# Patient Record
Sex: Female | Born: 1980
Health system: Southern US, Community
[De-identification: ages and names within clinical notes are randomized; demographics above are authoritative.]

## PROBLEM LIST (undated history)

## (undated) ENCOUNTER — Inpatient Hospital Stay: Admission: RE | Payer: BC Managed Care – PPO | Source: Ambulatory Visit | Admitting: Obstetrics and Gynecology

## (undated) DIAGNOSIS — Z9889 Other specified postprocedural states: Secondary | ICD-10-CM

## (undated) DIAGNOSIS — R319 Hematuria, unspecified: Secondary | ICD-10-CM

## (undated) DIAGNOSIS — D649 Anemia, unspecified: Secondary | ICD-10-CM

## (undated) DIAGNOSIS — T7840XA Allergy, unspecified, initial encounter: Secondary | ICD-10-CM

## (undated) DIAGNOSIS — I10 Essential (primary) hypertension: Secondary | ICD-10-CM

## (undated) DIAGNOSIS — R112 Nausea with vomiting, unspecified: Secondary | ICD-10-CM

## (undated) HISTORY — DX: Allergy, unspecified, initial encounter: T78.40XA

## (undated) HISTORY — DX: Other specified postprocedural states: Z98.890

## (undated) HISTORY — PX: TUBAL LIGATION: SHX77

## (undated) HISTORY — DX: Essential (primary) hypertension: I10

## (undated) HISTORY — DX: Hematuria, unspecified: R31.9

## (undated) HISTORY — PX: OTHER SURGICAL HISTORY: SHX169

## (undated) HISTORY — DX: Anemia, unspecified: D64.9

## (undated) HISTORY — PX: LASIK: SHX215

## (undated) HISTORY — DX: Nausea with vomiting, unspecified: R11.2

---

## 2009-10-24 DIAGNOSIS — J329 Chronic sinusitis, unspecified: Secondary | ICD-10-CM | POA: Insufficient documentation

## 2010-05-06 ENCOUNTER — Encounter: Payer: Self-pay | Admitting: Obstetrics and Gynecology

## 2011-01-27 ENCOUNTER — Encounter: Payer: Self-pay | Admitting: Obstetrics and Gynecology

## 2011-03-22 ENCOUNTER — Observation Stay: Payer: Self-pay

## 2011-03-26 ENCOUNTER — Observation Stay: Payer: Self-pay

## 2011-04-08 ENCOUNTER — Inpatient Hospital Stay: Payer: Self-pay

## 2012-09-13 IMAGING — US US OB DETAIL+14 WK - NRPT [facility]
1 series · 14 of 28 positions shown · non-contrast
Comparison: none

[Series 1: us ob detail+14 wk - nrpt [facility] · 14 of 109 slices shown]
[im 5/109]
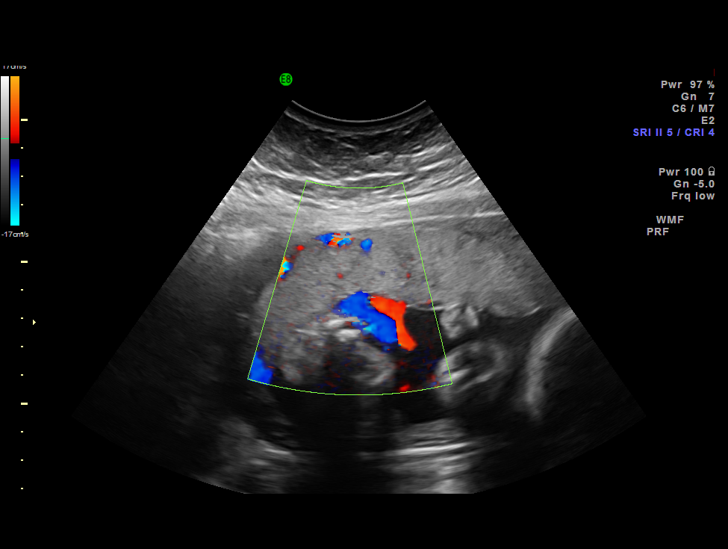
[im 13/109]
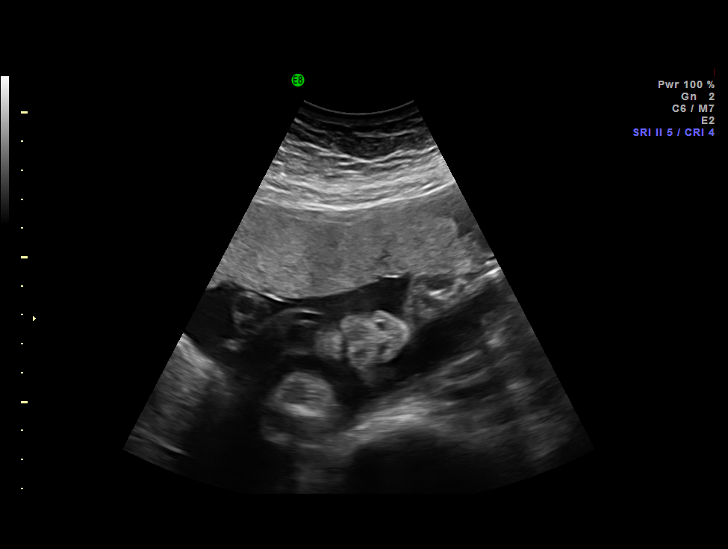
[im 21/109]
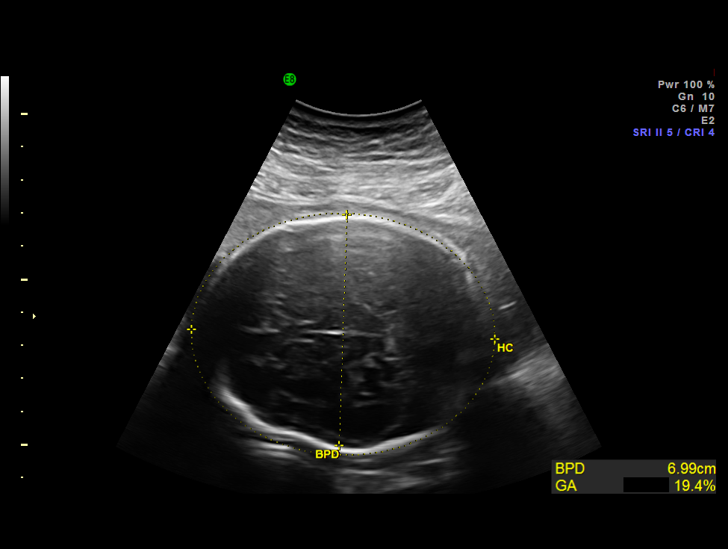
[im 29/109]
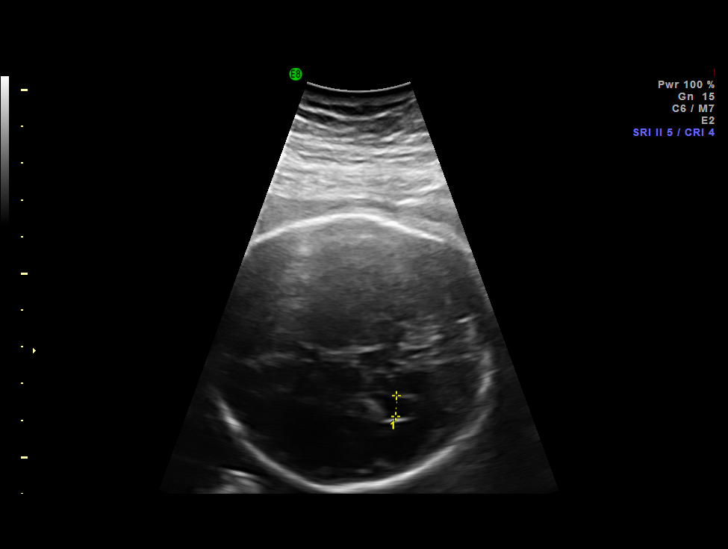
[im 37/109]
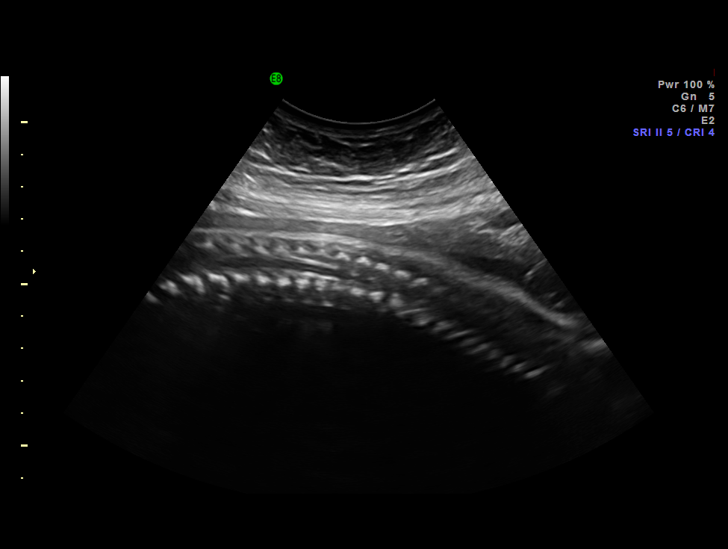
[im 45/109]
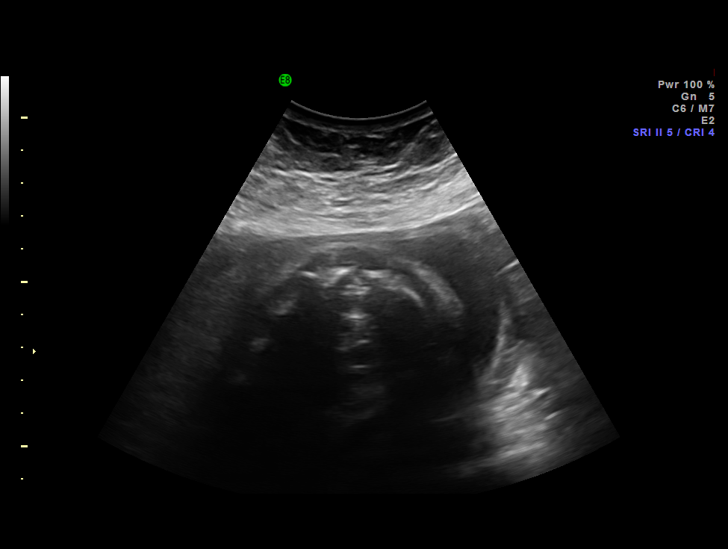
[im 53/109]
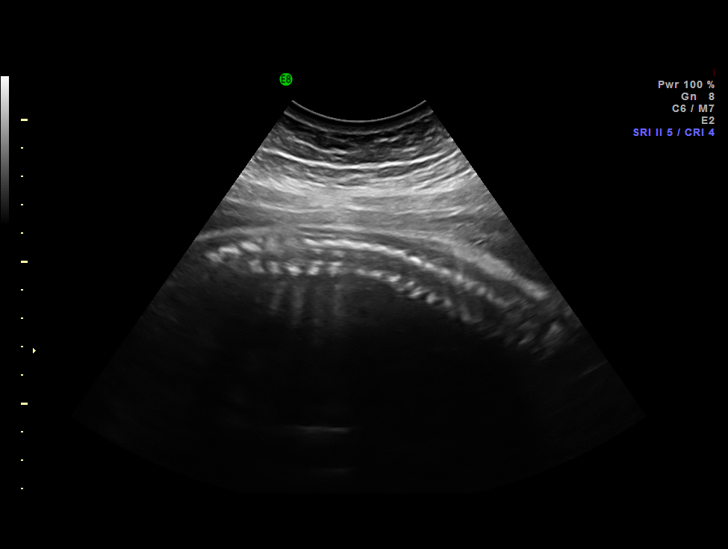
[im 61/109]
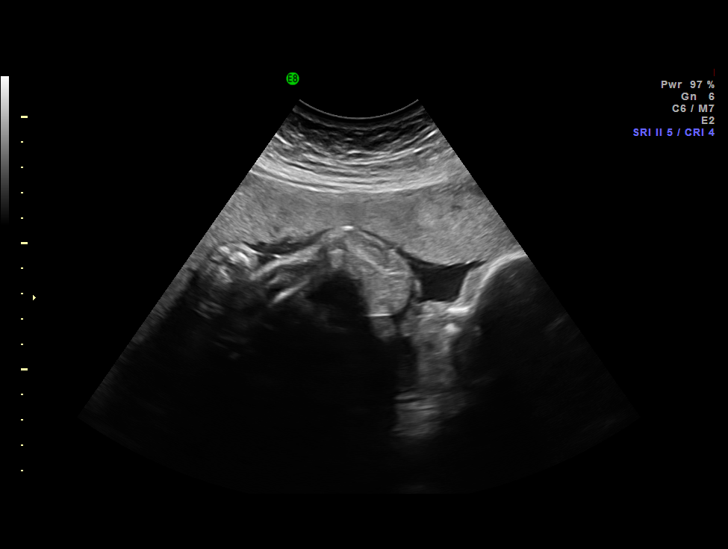
[im 69/109]
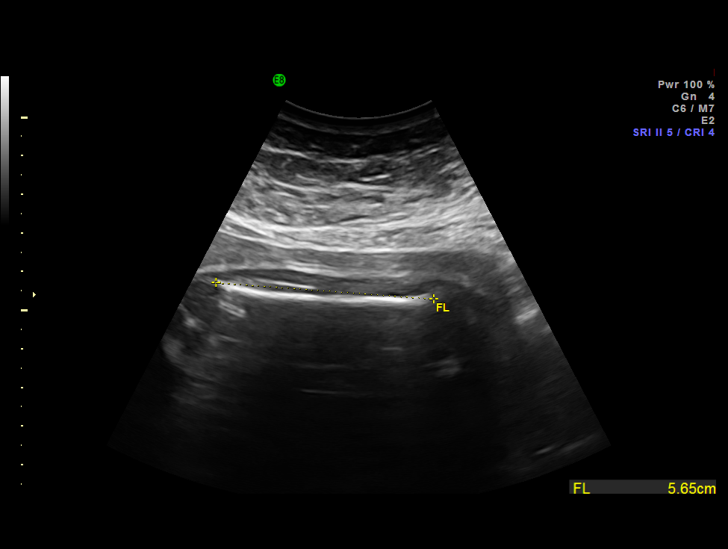
[im 77/109]
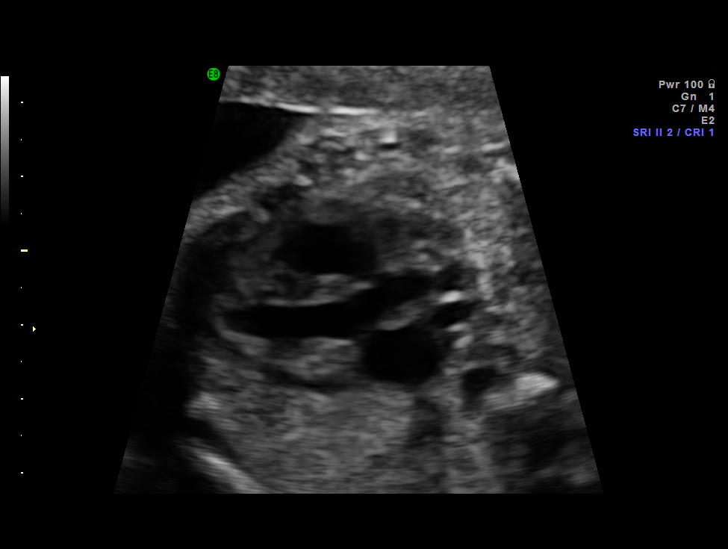
[im 85/109]
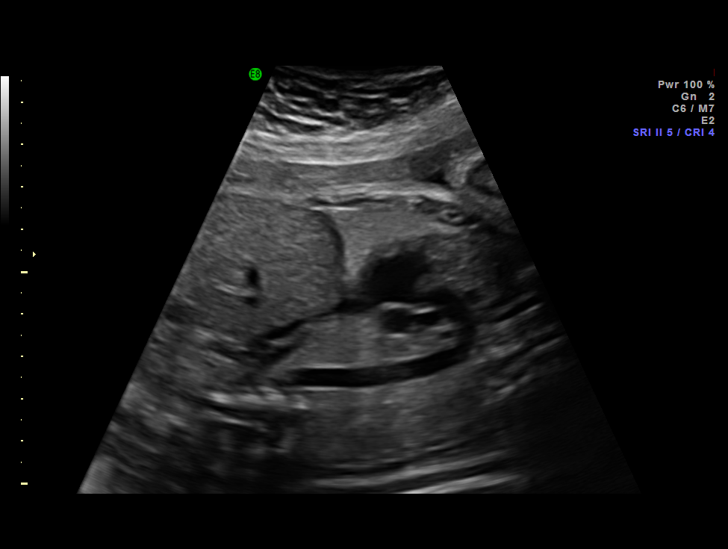
[im 93/109]
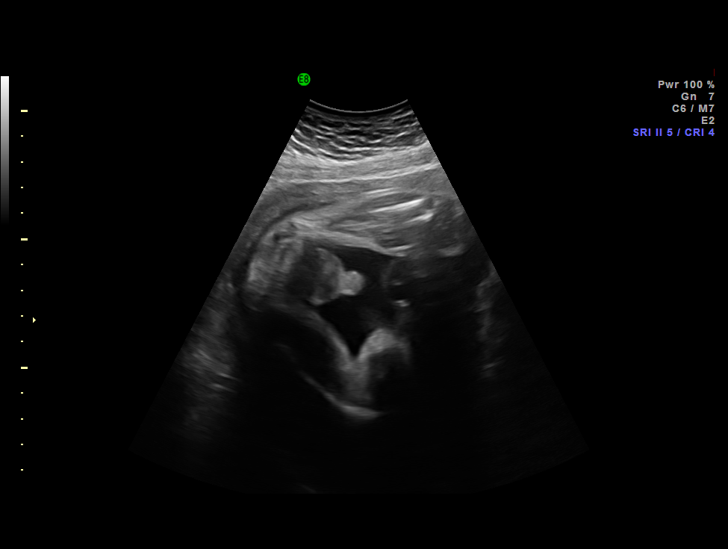
[im 101/109]
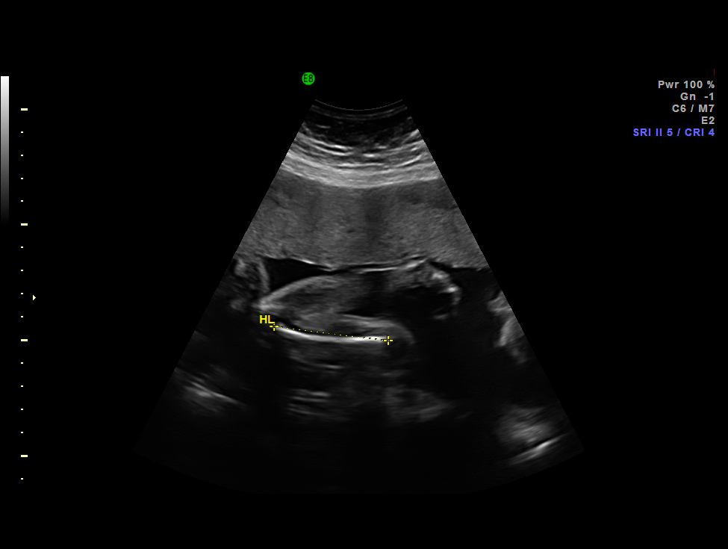
[im 109/109]
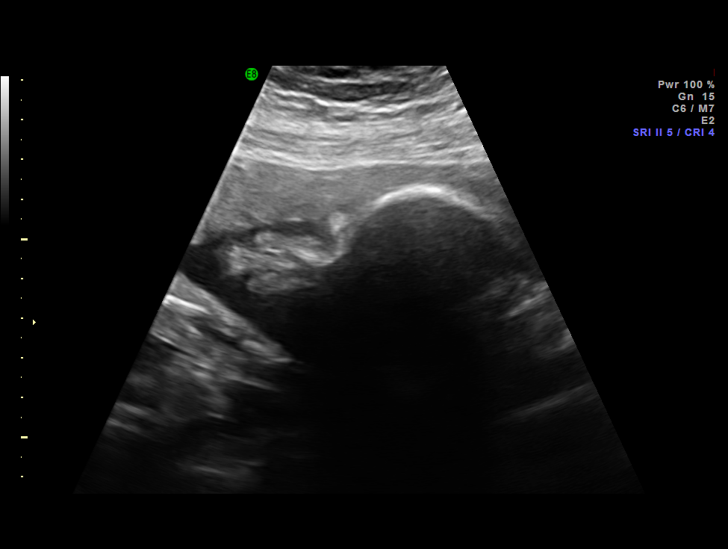

[14 of 28 positions shown; findings below may reference images not displayed]

IMAGES IMPORTED FROM THE SYNGO WORKFLOW SYSTEM
NO DICTATION FOR STUDY

## 2013-07-13 ENCOUNTER — Ambulatory Visit: Payer: Self-pay | Admitting: Family Medicine

## 2013-07-13 LAB — BASIC METABOLIC PANEL
BUN: 15 mg/dL (ref 4–21)
Creatinine: 1 mg/dL (ref 0.5–1.1)
Glucose: 85 mg/dL
Potassium: 3.8 mmol/L (ref 3.4–5.3)
Sodium: 141 mmol/L (ref 137–147)

## 2013-07-13 LAB — TSH: TSH: 2.49 u[IU]/mL (ref 0.41–5.90)

## 2013-07-13 LAB — HEPATIC FUNCTION PANEL
ALT: 19 U/L (ref 7–35)
AST: 16 U/L (ref 13–35)

## 2013-08-18 ENCOUNTER — Encounter: Payer: Self-pay | Admitting: Obstetrics and Gynecology

## 2014-06-08 LAB — HM PAP SMEAR: HM Pap smear: NEGATIVE

## 2014-06-11 ENCOUNTER — Other Ambulatory Visit: Payer: Self-pay

## 2014-06-11 LAB — HCG, QUANTITATIVE, PREGNANCY: Beta Hcg, Quant.: 114418 m[IU]/mL — ABNORMAL HIGH

## 2014-08-24 ENCOUNTER — Encounter: Payer: Self-pay | Admitting: Maternal & Fetal Medicine

## 2014-08-26 LAB — OB RESULTS CONSOLE VARICELLA ZOSTER ANTIBODY, IGG: Varicella: IMMUNE

## 2014-08-26 LAB — OB RESULTS CONSOLE HIV ANTIBODY (ROUTINE TESTING): HIV: NONREACTIVE

## 2014-08-26 LAB — OB RESULTS CONSOLE HEPATITIS B SURFACE ANTIGEN: HEP B S AG: NEGATIVE

## 2014-08-26 LAB — OB RESULTS CONSOLE GC/CHLAMYDIA
CHLAMYDIA, DNA PROBE: NEGATIVE
GC PROBE AMP, GENITAL: NEGATIVE

## 2014-08-26 LAB — OB RESULTS CONSOLE RUBELLA ANTIBODY, IGM: Rubella: IMMUNE

## 2014-08-26 LAB — OB RESULTS CONSOLE RPR: RPR: NONREACTIVE

## 2014-08-26 LAB — OB RESULTS CONSOLE ANTIBODY SCREEN: ANTIBODY SCREEN: NEGATIVE

## 2014-08-26 LAB — OB RESULTS CONSOLE ABO/RH: RH Type: POSITIVE

## 2015-01-05 NOTE — Consult Note (Signed)
Referral Information:  Reason for Referral 34yo [id number]  with longstanding hypertension, personal history of club feet and 19 yo son bilateral club feet. Pt has elevated BMI.   Referring Physician [facility]/ Dr Lethea Killings family medicine   Prenatal Hx HTN since age 17 - she was released from Nephrology care and is being seen by her family MD ,Dr Venia Minks, she has been on Labetalol and HCTZ during the pregnancy, d/ced the Norvasc when she found out she was pregnant.  REcent blood work at her family medicine office - I don't have access to that labwork  today..   Past Obstetrical Hx 03/2010 - 7 week SAB, no D&C, no sac or pole on u/s 2012  Cesarean for active phase arrest - no preeclampsia did not use aspirin, 6lbs 5 oz female bilateral clubbed feet splinting with Dr Chiquita Loth at [facility] Medications: Medication Instructions Status  prenatal multivitamin 1   once a day  Active  labetalol 100 mg oral tablet 1  orally 2 times a day  Active  Percocet 5/325 oral tablet 1-2 tab(s) orally every 4 to 6 hours, As Needed Active  Ferralet 90 oral tablet 1 tab(s) orally once a day Active   Allergies:   Morphine: Agitation  Vital Signs/Notes:  Nursing Vital Signs: **Vital Signs.:   04-Dec-14 10:05  Vital Signs Type Admission  Temperature Temperature (F) 97.8  Celsius 36.5  Temperature Source oral  Pulse Pulse 68  Respirations Respirations 18  Systolic BP Systolic BP 381  Diastolic BP (mmHg) Diastolic BP (mmHg) 72  Mean BP 88  Pulse Ox % Pulse Ox % 99  Pulse Ox Activity Level  At rest  Oxygen Delivery Room Air/ 21 %   Perinatal Consult:  LMP 26-Jun-2013   PGyn Hx Denies abnormal PAPs or STDs   PMed Hx Rubella Immune, Hx of varicella   Past Medical History cont'd 1.  Congenital club feet - corrected by Dr. Chiquita Loth at [facility] - no problems now 2.  ? Henoch-Schonlein pupura as a child (per nephrology notes and patient states she had a vascular rash at age 72 or 4 but didn't know that  this diagnosis was suspected) 3.  Hypertension since age 64 - was released  by Dr. 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  Chronic HTN since age 89 well controlled on 2 meds, no preeclampsia in past pregnancy (did not use aspirin)  2.  History of sab at 7 weeks  3.  Maternal h/o isolated club feet, son with club feet, met with genetic counselor - up to 50% recurrence 4. 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 elevated BMI 35- no gest DM in  last pregnancy   Recommendations 1.  We reviewed  risks of HTN in pregnancy including risk of fetal growth restriction  and risk of preeclampsia. She is currently on labetalol and HCTZ - a combination tha t has worked well for her . She is very well controlled on current regimen. While we don't usually intitiate HCTZ in pregnancy it is reasonable to continue it in those well controlled in it prior to pregnancy, There is a small risk of neonatal thrombocytopenia. Given the risk for preeclampsia with underlying HTN , I recommend baseline urine p/c ratio , baseline renal function tests, baseline liver function tests and serum uric acid to assist in comparison. Pt reports recent blood  work with Dr Venia Minks.   I did not repeat today though I did nothave access to those results - please draw labs as needed at her New Ob .  2.I reviewed USPTF recommendations for initiation of baby aspirin at 11-14 weeks for pts at risk for preeclampsia   She did not  take baby ASA last pregnancy.  I reviewed reduction in preex risk by approx  20-30%  3. Recommend monthly [place] for growth due to risk of FGR with maternal HTN , initiation of 2x weekly NST/AFI at 32-24 weeks and delivery by 39 weeks. 4. pt discussed prenatal diagnosis options with the gen counselor 5. Pior cesarean - pt will likely opt for ERLTCS . 6. Pt should limit weight gain to approximately 10-15 pounds- we did not discuss but referral to dietitian may be helpful. Needs diabetes testing during pregnancy. Early and late screening glucola should be considered.   Plan:  Prenatal Diagnosis Options Level II [place]   Ultrasound at what gestational ages Monthly > 28 weeks   Antepartum Testing depending on BP control 32 -34 weeks   Delivery Mode Cesarean, unless opts for VBAC   Additional Testing Folate/prenatal vitamins   Delivery at what gestational age [redacted] weeks    Total Time Spent with Patient 15 minutes, 20 min   >50% of visit spent in couseling/coordination of care yes   Office Use Only 99241  Level 1 (54mn) NEW office consult prob focused   Coding Description: MATERNAL CONDITIONS/HISTORY INDICATION(S).   Chronic HTN.   HTN - Chronic.  Electronic Signatures: LSharyn Creamer(MD)  (Signed 04-Dec-14 11:22)  Authored: Referral, Home Medications, Allergies, Vital Signs/Notes, Consult, Exam, Lab/Radiology Notes, Impression, Plan, Billing, Coding Description   Last Updated: 04-Dec-14 11:22 by LSharyn Creamer(MD)

## 2015-01-06 NOTE — Consult Note (Signed)
Referral Information:  Reason for Referral 34 yo gravida 4 para 1021 at 15w4dis referred by [facility] due to a history of CHTN, a personal history of bilateral club feet and a history of a son with bilateral club feet.   Referring Physician Dr. DEnzo Bi  [facility]x [facility].  [facility] [place]  Fetus then was 930w1dize at 9w55w4d Early [facility], which led to high blood pressure.  She had a subsequent ultrasound around the time of NIPT.  NIPT revealed normal XX.  She had a glucose screen on Tuesday which she presumes was normal.   Past Obstetrical Hx 1. 2011 - SAB at [redacted] weeks gestation 2. 03/11/2011 - C/S for failure to progress.  [facility]   Home Medications: Medication Instructions Status  prenatal multivitamin 1   once a day  Active  labetalol 200 mg oral tablet 300 milligram(s) orally once a day Active  labetalol 200 mg oral tablet 1 tab(s) orally once a day Active  hydrochlorothiazide 25 mg oral tablet 1 tab(s) orally once a day Active   Allergies:   Morphine: Agitation  Vital Signs/Notes:  Nursing Vital Signs: **Vital Signs.:   10-Dec-15 08:06  Vital Signs Type Routine  Temperature Temperature (F) 98.1  Celsius 36.7  Temperature Source oral  Pulse Pulse 76  Respirations Respirations 18  Systolic BP Systolic BP 115854iastolic BP (mmHg) Diastolic BP (mmHg) 63  Mean BP 80  Pulse Ox % Pulse Ox % 97  Pulse Ox Activity Level  At rest  Oxygen Delivery Room Air/ 21 %   Perinatal Consult:  LMP 16-Apr-2014   PGyn Hx Benign   PMed Hx Rubella Immune   Past Medical History cont'd HTN - diagnosed when hospitalized for Bell's palsy at age 86 73story of Bell's palsy - [facility] with bilateral club feet Tore ACL at age [age]id not require surgery   PSurg Hx  Bilateral club foot repair at 2 y52ar of age; revision on left foot age 24  61FHx Son with bilateral club feet -repaired; Mother - HTN; Father - HTN; MGM - CABG;   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 CHTN on labetalol 300 mg q AM and 200 mg q PM and HCTZ 25 mg daily 2. A personal history of bilateral club feet and a history of a son with bilateral club feet - S/P normal NIPT; normal anatomy ultrasound today 3. Previous cesarean 4. Obesity - with assumed normal glucose screen   Recommendations 1. CHTN on labetalol 300 mg q AM and 200 mg q PM and HCTZ 25 mg daily -- The patient was counseled that labetalol is a first line antihypertensive in pregnancy.  While HCTZ is not,  and we do not usually start it during pregnancy, we do continue it in women who are stable on a dose of 25 mg or less -- ASA 81 mg po daily -- Our recommendations for fetal surveillance are below. 2. A personal history of bilateral club feet and a history of a son with bilateral club feet - S/P normal NIPT; normal anatomy ultrasound today -- Had a follow-up visit with our genetic counselor, Donette Larry, today.  She had met with the couple last pregnancy.  The couple wanted to know if any other genetic counseling was indicated or desirable. - The patient will have AFP drawn in Dr. Thamas Jaegers office today. 3. Previous cesarean -- The patient is planning a repeat cesarean and when we discussed the fact that there is  essentially no additional surgical risk of having a BTL with the cesarean, she will likely elect a BTL rather than have her husband have a vasectomy 4. Obesity - with assumed normal glucose screen -- Fetal surveillance for CHTN -- Repeat glucose screen at 26-28 weeks   Plan:  Genetic Counseling yes, Done in the past; updated today   Prenatal Diagnosis Options First trimester, NIPT done - normal; AFP today   Ultrasound at what gestational ages Monthly > 28 weeks   Antepartum Testing Weekly, Starting at 32 weeks; Twice weekly starting at 34-36 weeks   Delivery Mode Cesarean   Delivery at what gestational age [redacted] weeks   Comment/Plan Thank you for allowing [place] to participate in her care.    Total Time Spent with Patient 45 minutes   >50% of visit spent in couseling/coordination of care yes   Office Use Only 99243  Level 3 (74mn) NEW office consult detailed   Coding Description: FETAL - 2nd/3rd TRIMESTER INDICATION(S).   MATERNAL CONDITIONS/HISTORY INDICATION(S).   HTN - Chronic.   Obesity - BMI greater than equal to 30.   OTHER: Patient with bilateral club feet; Previous child with bilateral club feet.  Electronic Signatures: JDellia Nims(MD)  (Signed 10-Dec-15 14:52)  Authored: Referral, Home Medications, Allergies, Vital Signs/Notes, Consult, Exam, Impression, Plan, Billing, Coding Description   Last Updated: 10-Dec-15 14:52 by JDellia Nims(MD)

## 2015-01-12 ENCOUNTER — Ambulatory Visit: Payer: Self-pay

## 2015-01-12 ENCOUNTER — Ambulatory Visit
Admit: 2015-01-12 | Disposition: A | Discharge status: Home / Self Care | Payer: Self-pay | Admitting: Obstetrics and Gynecology

## 2015-01-12 LAB — CBC WITH DIFFERENTIAL/PLATELET
BASOS PCT: 0.2 %
Basophil #: 0 10*3/uL (ref 0.0–0.1)
Eosinophil #: 0.1 10*3/uL (ref 0.0–0.7)
Eosinophil %: 1 %
HCT: 36.8 % (ref 35.0–47.0)
HGB: 12.3 g/dL (ref 12.0–16.0)
LYMPHS PCT: 14.9 %
Lymphocyte #: 1.2 10*3/uL (ref 1.0–3.6)
MCH: 31.4 pg (ref 26.0–34.0)
MCHC: 33.3 g/dL (ref 32.0–36.0)
MCV: 94 fL (ref 80–100)
Monocyte #: 0.5 x10 3/mm (ref 0.2–0.9)
Monocyte %: 6.8 %
Neutrophil #: 6.1 10*3/uL (ref 1.4–6.5)
Neutrophil %: 77.1 %
Platelet: 174 10*3/uL (ref 150–440)
RBC: 3.9 10*6/uL (ref 3.80–5.20)
RDW: 15.9 % — ABNORMAL HIGH (ref 11.5–14.5)
WBC: 7.9 10*3/uL (ref 3.6–11.0)

## 2015-01-12 NOTE — Anesthesia Preprocedure Evaluation (Addendum)
Anesthesia Evaluation  Patient identified by MRN, date of birth, ID band Patient awake    Reviewed: Allergy & Precautions, H&P , NPO status , Patient's Chart, lab work & pertinent test results  History of Anesthesia Complications (+) PONV and history of anesthetic complications  Airway Mallampati: I  TM Distance: >3 FB Neck ROM: Full    Dental  (+) Teeth Intact   Pulmonary  breath sounds clear to auscultation        Cardiovascular Exercise Tolerance: Good hypertension, On Medications and Pt. on medications IRhythm:Regular Rate:Normal     Neuro/Psych    GI/Hepatic GERD-  ,  Endo/Other    Renal/GU      Musculoskeletal   Abdominal   Peds  Hematology   Anesthesia Other Findings   Reproductive/Obstetrics (+) Pregnancy                           Anesthesia Physical Anesthesia Plan  ASA: II  Anesthesia Plan: Spinal   Post-op Pain Management:    Induction:   Airway Management Planned: Nasal Cannula  Additional Equipment: Fetal Monitoring  Intra-op Plan:   Post-operative Plan:   Informed Consent: I have reviewed the patients History and Physical, chart, labs and discussed the procedure including the risks, benefits and alternatives for the proposed anesthesia with the patient or authorized representative who has indicated his/her understanding and acceptance.     Plan Discussed with: CRNA  Anesthesia Plan Comments:         Anesthesia Quick Evaluation

## 2015-01-15 ENCOUNTER — Inpatient Hospital Stay: Payer: BC Managed Care – PPO | Admitting: *Deleted

## 2015-01-15 ENCOUNTER — Inpatient Hospital Stay: Payer: BC Managed Care – PPO | Admitting: Anesthesiology

## 2015-01-15 ENCOUNTER — Inpatient Hospital Stay
Admission: RE | Admit: 2015-01-15 | Discharge: 2015-01-18 | DRG: 765 | Disposition: A | Discharge status: Home / Self Care | Payer: BC Managed Care – PPO | Attending: Obstetrics and Gynecology | Admitting: Obstetrics and Gynecology

## 2015-01-15 ENCOUNTER — Encounter
Admission: RE | Disposition: A | Discharge status: Home / Self Care | Payer: Self-pay | Source: Home / Self Care | Attending: Obstetrics and Gynecology

## 2015-01-15 DIAGNOSIS — O1092 Unspecified pre-existing hypertension complicating childbirth: Secondary | ICD-10-CM | POA: Diagnosis present

## 2015-01-15 DIAGNOSIS — Z302 Encounter for sterilization: Secondary | ICD-10-CM | POA: Diagnosis not present

## 2015-01-15 DIAGNOSIS — O3421 Maternal care for scar from previous cesarean delivery: Secondary | ICD-10-CM | POA: Diagnosis present

## 2015-01-15 DIAGNOSIS — Z3A39 39 weeks gestation of pregnancy: Secondary | ICD-10-CM | POA: Diagnosis present

## 2015-01-15 DIAGNOSIS — Z3490 Encounter for supervision of normal pregnancy, unspecified, unspecified trimester: Secondary | ICD-10-CM

## 2015-01-15 SURGERY — Surgical Case
Anesthesia: Spinal | Wound class: Clean Contaminated

## 2015-01-15 MED ORDER — LIDOCAINE 5 % EX PTCH
MEDICATED_PATCH | CUTANEOUS | Status: DC | PRN
  Administered 2015-01-15: 1 via TRANSDERMAL

## 2015-01-15 MED ORDER — ACETAMINOPHEN 325 MG PO TABS: 650.0000 mg | ORAL_TABLET | ORAL | Status: DC | PRN

## 2015-01-15 MED ORDER — OXYTOCIN 40 UNITS IN LACTATED RINGERS INFUSION - SIMPLE MED
INTRAVENOUS | Status: DC | PRN
  Administered 2015-01-15: 40 [IU] via INTRAVENOUS

## 2015-01-15 MED ORDER — LACTATED RINGERS IV SOLN
INTRAVENOUS | Status: DC | PRN
  Administered 2015-01-15 (×2): via INTRAVENOUS

## 2015-01-15 MED ORDER — SCOPOLAMINE 1 MG/3DAYS TD PT72: 1.0000 | MEDICATED_PATCH | Freq: Once | TRANSDERMAL | Status: DC

## 2015-01-15 MED ORDER — DIBUCAINE 1 % RE OINT
1.0000 "application " | TOPICAL_OINTMENT | RECTAL | Status: DC | PRN
  Filled 2015-01-15: qty 28

## 2015-01-15 MED ORDER — POTASSIUM CITRATE-CITRIC ACID 1100-334 MG/5ML PO SOLN
10.0000 meq | Freq: Three times a day (TID) | ORAL | Status: DC
  Filled 2015-01-15 (×3): qty 5

## 2015-01-15 MED ORDER — OXYCODONE-ACETAMINOPHEN 5-325 MG PO TABS
1.0000 | ORAL_TABLET | ORAL | Status: DC | PRN
  Administered 2015-01-16 (×2): 1 via ORAL
  Filled 2015-01-15 (×5): qty 1

## 2015-01-15 MED ORDER — LACTATED RINGERS IV SOLN: INTRAVENOUS | Status: DC

## 2015-01-15 MED ORDER — CEFAZOLIN SODIUM-DEXTROSE 2-3 GM-% IV SOLR
INTRAVENOUS | Status: AC
  Filled 2015-01-15: qty 50

## 2015-01-15 MED ORDER — SIMETHICONE 80 MG PO CHEW
80.0000 mg | CHEWABLE_TABLET | ORAL | Status: DC
  Administered 2015-01-16: 80 mg via ORAL

## 2015-01-15 MED ORDER — OXYCODONE-ACETAMINOPHEN 5-325 MG PO TABS
2.0000 | ORAL_TABLET | ORAL | Status: DC | PRN
  Administered 2015-01-16 – 2015-01-18 (×9): 2 via ORAL
  Filled 2015-01-15 (×2): qty 2
  Filled 2015-01-15: qty 1
  Filled 2015-01-15 (×4): qty 2

## 2015-01-15 MED ORDER — PRENATAL MULTIVITAMIN CH
1.0000 | ORAL_TABLET | Freq: Every day | ORAL | Status: DC
  Administered 2015-01-16 – 2015-01-18 (×3): 1 via ORAL
  Filled 2015-01-15 (×3): qty 1

## 2015-01-15 MED ORDER — KETOROLAC TROMETHAMINE 30 MG/ML IJ SOLN
30.0000 mg | Freq: Four times a day (QID) | INTRAMUSCULAR | Status: DC | PRN
  Administered 2015-01-15: 30 mg via INTRAVENOUS
  Filled 2015-01-15: qty 1

## 2015-01-15 MED ORDER — MORPHINE SULFATE (PF) 0.5 MG/ML IJ SOLN
INTRAMUSCULAR | Status: DC | PRN
Start: 2015-01-15 — End: 2015-01-15
  Administered 2015-01-15: .1 mg via INTRATHECAL

## 2015-01-15 MED ORDER — SODIUM CHLORIDE 0.9 % IJ SOLN: 3.0000 mL | INTRAMUSCULAR | Status: DC | PRN

## 2015-01-15 MED ORDER — ONDANSETRON HCL 4 MG/2ML IJ SOLN
INTRAMUSCULAR | Status: DC | PRN
  Administered 2015-01-15: 4 mg via INTRAVENOUS

## 2015-01-15 MED ORDER — OXYTOCIN 40 UNITS IN LACTATED RINGERS INFUSION - SIMPLE MED
62.5000 mL/h | INTRAVENOUS | Status: DC
  Administered 2015-01-15: 62.5 mL/h via INTRAVENOUS
  Filled 2015-01-15: qty 1000

## 2015-01-15 MED ORDER — MEASLES, MUMPS & RUBELLA VAC ~~LOC~~ INJ: 0.5000 mL | INJECTION | Freq: Once | SUBCUTANEOUS | Status: DC

## 2015-01-15 MED ORDER — CITRIC ACID-SODIUM CITRATE 334-500 MG/5ML PO SOLN
ORAL | Status: AC
  Administered 2015-01-15: 30 mL via ORAL
  Filled 2015-01-15: qty 15

## 2015-01-15 MED ORDER — NALBUPHINE HCL 10 MG/ML IJ SOLN: 5.0000 mg | Freq: Once | INTRAMUSCULAR | Status: AC | PRN

## 2015-01-15 MED ORDER — FERROUS SULFATE 325 (65 FE) MG PO TABS
325.0000 mg | ORAL_TABLET | Freq: Two times a day (BID) | ORAL | Status: DC
  Administered 2015-01-15 – 2015-01-18 (×5): 325 mg via ORAL
  Filled 2015-01-15 (×5): qty 1

## 2015-01-15 MED ORDER — CEFAZOLIN SODIUM-DEXTROSE 2-3 GM-% IV SOLR
2.0000 g | INTRAVENOUS | Status: AC
  Administered 2015-01-15: 2 g via INTRAVENOUS

## 2015-01-15 MED ORDER — CITRIC ACID-SODIUM CITRATE 334-500 MG/5ML PO SOLN
30.0000 mL | Freq: Once | ORAL | Status: AC
  Administered 2015-01-15: 30 mL via ORAL

## 2015-01-15 MED ORDER — SIMETHICONE 80 MG PO CHEW
80.0000 mg | CHEWABLE_TABLET | Freq: Three times a day (TID) | ORAL | Status: DC
  Administered 2015-01-17 (×3): 80 mg via ORAL
  Filled 2015-01-15 (×7): qty 1

## 2015-01-15 MED ORDER — LACTATED RINGERS IV BOLUS (SEPSIS)
1000.0000 mL | Freq: Once | INTRAVENOUS | Status: AC
  Administered 2015-01-15: 08:00:00 via INTRAVENOUS
  Administered 2015-01-15: 1000 mL via INTRAVENOUS

## 2015-01-15 MED ORDER — LACTATED RINGERS IV BOLUS (SEPSIS)
1000.0000 mL | Freq: Once | INTRAVENOUS | Status: AC
  Administered 2015-01-15: 1000 mL via INTRAVENOUS

## 2015-01-15 MED ORDER — SENNOSIDES-DOCUSATE SODIUM 8.6-50 MG PO TABS
2.0000 | ORAL_TABLET | ORAL | Status: DC
  Administered 2015-01-16: 2 via ORAL
  Filled 2015-01-15 (×3): qty 2

## 2015-01-15 MED ORDER — TETANUS-DIPHTH-ACELL PERTUSSIS 5-2.5-18.5 LF-MCG/0.5 IM SUSP: 0.5000 mL | Freq: Once | INTRAMUSCULAR | Status: DC

## 2015-01-15 MED ORDER — DIPHENHYDRAMINE HCL 50 MG/ML IJ SOLN: 12.5000 mg | INTRAMUSCULAR | Status: DC | PRN

## 2015-01-15 MED ORDER — NALBUPHINE HCL 10 MG/ML IJ SOLN
5.0000 mg | Freq: Once | INTRAMUSCULAR | Status: AC | PRN
  Filled 2015-01-15: qty 0.5

## 2015-01-15 MED ORDER — EPHEDRINE SULFATE 50 MG/ML IJ SOLN
INTRAMUSCULAR | Status: DC | PRN
  Administered 2015-01-15 (×3): 5 mg via INTRAVENOUS

## 2015-01-15 MED ORDER — SIMETHICONE 80 MG PO CHEW
80.0000 mg | CHEWABLE_TABLET | ORAL | Status: DC | PRN
  Administered 2015-01-15 – 2015-01-16 (×2): 80 mg via ORAL

## 2015-01-15 MED ORDER — NALOXONE HCL 1 MG/ML IJ SOLN: 1.0000 ug/kg/h | INTRAVENOUS | Status: DC | PRN

## 2015-01-15 MED ORDER — WITCH HAZEL-GLYCERIN EX PADS: 1.0000 "application " | MEDICATED_PAD | CUTANEOUS | Status: DC | PRN

## 2015-01-15 MED ORDER — MENTHOL 3 MG MT LOZG: 1.0000 | LOZENGE | OROMUCOSAL | Status: DC | PRN

## 2015-01-15 MED ORDER — ONDANSETRON HCL 4 MG/2ML IJ SOLN: 4.0000 mg | Freq: Three times a day (TID) | INTRAMUSCULAR | Status: DC | PRN

## 2015-01-15 MED ORDER — NALOXONE HCL 0.4 MG/ML IJ SOLN
0.4000 mg | INTRAMUSCULAR | Status: DC | PRN
  Filled 2015-01-15: qty 1

## 2015-01-15 MED ORDER — DIPHENHYDRAMINE HCL 25 MG PO CAPS: 25.0000 mg | ORAL_CAPSULE | ORAL | Status: DC | PRN

## 2015-01-15 MED ORDER — IBUPROFEN 800 MG PO TABS
800.0000 mg | ORAL_TABLET | Freq: Three times a day (TID) | ORAL | Status: DC
  Administered 2015-01-15 – 2015-01-18 (×9): 800 mg via ORAL
  Filled 2015-01-15 (×9): qty 1

## 2015-01-15 MED ORDER — LIDOCAINE 5 % EX PTCH
1.0000 | MEDICATED_PATCH | CUTANEOUS | Status: DC
  Administered 2015-01-16 – 2015-01-17 (×2): 1 via TRANSDERMAL
  Filled 2015-01-15 (×3): qty 1

## 2015-01-15 MED ORDER — LANOLIN HYDROUS EX OINT
1.0000 "application " | TOPICAL_OINTMENT | CUTANEOUS | Status: DC | PRN
  Filled 2015-01-15: qty 28

## 2015-01-15 MED ORDER — NALBUPHINE HCL 10 MG/ML IJ SOLN: 5.0000 mg | INTRAMUSCULAR | Status: DC | PRN

## 2015-01-15 MED ORDER — LIDOCAINE 5 % EX PTCH
MEDICATED_PATCH | CUTANEOUS | Status: AC
Start: 2015-01-15 — End: 2015-01-15
  Filled 2015-01-15: qty 1

## 2015-01-15 MED ORDER — KETOROLAC TROMETHAMINE 30 MG/ML IJ SOLN: 30.0000 mg | Freq: Four times a day (QID) | INTRAMUSCULAR | Status: DC | PRN

## 2015-01-15 MED ORDER — DIPHENHYDRAMINE HCL 25 MG PO CAPS: 25.0000 mg | ORAL_CAPSULE | Freq: Four times a day (QID) | ORAL | Status: DC | PRN

## 2015-01-15 SURGICAL SUPPLY — 22 items
CANISTER SUCT 3000ML (MISCELLANEOUS) ×2 IMPLANT
CHLORAPREP W/TINT 26ML (MISCELLANEOUS) ×2 IMPLANT
DRSG TELFA 3X8 NADH (GAUZE/BANDAGES/DRESSINGS) ×2 IMPLANT
GAUZE SPONGE 4X4 12PLY STRL (GAUZE/BANDAGES/DRESSINGS) ×2 IMPLANT
GLOVE BIO SURGEON STRL SZ8 (GLOVE) ×2 IMPLANT
GOWN STRL REUS W/ TWL LRG LVL3 (GOWN DISPOSABLE) ×2 IMPLANT
GOWN STRL REUS W/ TWL XL LVL3 (GOWN DISPOSABLE) ×1 IMPLANT
GOWN STRL REUS W/TWL LRG LVL3 (GOWN DISPOSABLE) ×2
GOWN STRL REUS W/TWL XL LVL3 (GOWN DISPOSABLE) ×1
NS IRRIG 1000ML POUR BTL (IV SOLUTION) ×2 IMPLANT
PACK C SECTION AR (MISCELLANEOUS) ×2 IMPLANT
PAD GROUND ADULT SPLIT (MISCELLANEOUS) ×2 IMPLANT
PAD OB MATERNITY 4.3X12.25 (PERSONAL CARE ITEMS) ×2 IMPLANT
PAD PREP 24X41 OB/GYN DISP (PERSONAL CARE ITEMS) ×2 IMPLANT
STRAP SAFETY BODY (MISCELLANEOUS) ×2 IMPLANT
SUT CHROMIC 1-0 (SUTURE) ×6 IMPLANT
SUT MAXON ABS #0 GS21 30IN (SUTURE) ×4 IMPLANT
SUT PLAIN GUT 0 (SUTURE) ×4 IMPLANT
SUT VIC AB 2-0 CT1 27 (SUTURE) ×2
SUT VIC AB 2-0 CT1 TAPERPNT 27 (SUTURE) ×2 IMPLANT
SUT VIC AB 4-0 KS 27 (SUTURE) ×2 IMPLANT
TAPE CLOTH SOFT 2X10 (GAUZE/BANDAGES/DRESSINGS) ×2 IMPLANT

## 2015-01-15 NOTE — Transfer of Care (Signed)
Immediate Anesthesia Transfer of Care Note  Patient: Rebecca Freeman  Procedure(s) Performed: Procedure(s): REPEAT CESAREAN SECTION AND BILATERAL TUBAL LIGATION  (N/A)  Patient Location: PACU  Anesthesia Type:Spinal  Level of Consciousness: alert , oriented and patient cooperative  Airway & Oxygen Therapy: Patient Spontanous Breathing and room air  Post-op Assessment: Post -op Vital signs reviewed and stable  Post vital signs: Reviewed and stable  Last Vitals:  Filed Vitals:   01/15/15 0931  BP: 110/60  Pulse: 58  Temp: 35.7 C  Resp: 16    Complications: No apparent anesthesia complications

## 2015-01-15 NOTE — OR Nursing (Signed)
Placenta collected and labeled and placed in placenta refrigerator per policy to be held for 7 days.

## 2015-01-15 NOTE — Anesthesia Postprocedure Evaluation (Signed)
  Anesthesia Post-op Note  Patient: Rebecca Freeman  Procedure(s) Performed: Procedure(s): REPEAT CESAREAN SECTION AND BILATERAL TUBAL LIGATION  (N/A)  Anesthesia type:Spinal  Patient location: PACU  Post pain: Pain level controlled  Post assessment: Post-op Vital signs reviewed, Patient's Cardiovascular Status Stable, Respiratory Function Stable, Patent Airway and No signs of Nausea or vomiting  Post vital signs: Reviewed and stable  Last Vitals:  Filed Vitals:   01/15/15 0931  BP: 110/60  Pulse: 58  Temp: 35.7 C  Resp: 16    Level of consciousness: awake, alert  and patient cooperative  Complications: No apparent anesthesia complications

## 2015-01-15 NOTE — Anesthesia Procedure Notes (Signed)
Spinal Patient location during procedure: OR Start time: 01/15/2015 8:20 AM Staffing Anesthesiologist: RICE, AMY Performed by: anesthesiologist  Preanesthetic Checklist Completed: patient identified, IV checked and risks and benefits discussed Spinal Block Patient position: sitting Prep: Betadine Patient monitoring: heart rate, cardiac monitor, continuous pulse ox and blood pressure Approach: midline Location: L3-4 Injection technique: single-shot Needle Needle type: Whitacre and Introducer  Needle gauge: 25 G Needle length: 12.7 cm (119 mm)

## 2015-01-15 NOTE — Op Note (Signed)
Op note:  Preoperative diagnosis: 1. Term intrauterine pregnancy, undelivered 2. Chronic hypertension 3. Previous cesarean section delivery 4. Desires Elective Sterilization  Postoperative diagnosis: 1. Term intrauterine pregnancy, delivered 2. Chronic hypertension 3. Previous cesarean section delivery 4.  Viable Female   Operative procedure: 1. Repeat low transverse cesarean section 2. Bilateral partial salpingectomy  Surgeon Dr. Greggory KeeneFrancesco  First Asst.: Dr. Valentino Saxonherry  Anesthesia: Spinal, Dr. Dimple Caseyice  INDICATION: The patient is a 34 year old married white female gravida 4 para 1021 at 6839 gestation who presents for repeat cesarean section delivery and bilateral partial salpingectomy. Prenatal course was complicated by chronic hypertension.  DESCRIPTION OF PROCEDURE: Patient was brought to the operating room where she was placed in the supine position. Spinal anesthetic was introduced without difficulty. She was placed in the supine position with a right lateral hip roll in place.Foley catheter was inserted and was draining clear yellow urine. The abdomen was prepped with ChloraPrep solution and draped in a sterile manner. After timeout and checking for adequate level of anesthesia the procedure was started. Pfannenstiel incision was made into the abdomen. The fascia was incised transversely and extended bilaterally with Mayo scissors. The rectus muscle was dissected off the fascia through sharp and blunt dissection. The midline raphae was identified and separated and the peritoneum was entered. Bladder flap was created over lower uterine segment with sharp dissection. A low transverse incision was made in the uterus and this was extended cephalad and caudad in standard fashion. Amniotic fluid was clear. The infant was delivered through vertex presentation and was noted to be active at birth. The umbilical cord was doubly clamped and cut and the infant was handed off to the resuscitating team.  Cord blood sampling was not obtained. Placenta was expressed from the uterus. Uterus was externalized onto the anterior abdominal wall and was cleared of all debris with laps. The incision was closed in one layer using #1 chromic suture in a running locking manner. Bilateral partial salpingectomy was then performed in routine fashion. Fallopian tube was grasped with a Babcock clamp. Midsegment of the fallopian tube was tied off with two 0 plain sutures. The first tie was a free tie. A second stick tie was placed as well. The intervening tube segment was resected. Similar procedure was carried out on the contralateral tube. Good hemostasis was noted. The uterus was then placed back into the abdominal pelvic cavity. Gutters were cleared of all debris with laps. The incision was then closed in layers with 0 Maxon being used on the fascia in a simple running manner. Subcutaneous tissues were reapproximated with 2-0 Vicryl suture. The skin was closed with a subcuticular stitch of 4-0 Vicryl. Steri-Strips were placed. A Lidoderm patch was placed for analgesia. The patient was then mobilized and taken to the recovery room in satisfactory condition. Estimated blood loss: 750 mL IV fluids: 1800 mL Urine output 50 mL. All instruments and needles and sponge counts were verified as correct. The patient did receive Ancef 2 g antibiotic prophylaxis.

## 2015-01-15 NOTE — H&P (Signed)
Preop Clearance. Paper H&P done previously. No interval issues. Rebecca Freeman, Daphine DeutscherMARTIN, MD

## 2015-01-15 NOTE — Evaluation (Signed)
I just saw Rebecca Freeman, and she is doing well--no pain, no itching, no nausea.

## 2015-01-16 LAB — CBC
HEMATOCRIT: 33.1 % — AB (ref 35.0–47.0)
Hemoglobin: 11.4 g/dL — ABNORMAL LOW (ref 12.0–16.0)
MCH: 32.6 pg (ref 26.0–34.0)
MCHC: 34.5 g/dL (ref 32.0–36.0)
MCV: 94.5 fL (ref 80.0–100.0)
Platelets: 138 10*3/uL — ABNORMAL LOW (ref 150–440)
RBC: 3.5 MIL/uL — AB (ref 3.80–5.20)
RDW: 16.2 % — AB (ref 11.5–14.5)
WBC: 8.6 10*3/uL (ref 3.6–11.0)

## 2015-01-16 NOTE — Anesthesia Postprocedure Evaluation (Cosign Needed)
  Anesthesia Post-op Note  Patient: Rebecca Freeman  Procedure(s) Performed: Procedure(s): REPEAT CESAREAN SECTION AND BILATERAL TUBAL LIGATION  (N/A)  Anesthesia type:Spinal  Patient location: Floor  Post pain: Pain level controlled  Post assessment: Post-op Vital signs reviewed, Patient's Cardiovascular Status Stable, Respiratory Function Stable, Patent Airway and No signs of Nausea or vomiting  Post vital signs: Reviewed and stable  Last Vitals:  Filed Vitals:   01/16/15 0300  BP: 116/59  Pulse: 63  Temp: 36.7 C  Resp: 18    Level of consciousness: awake, alert  and patient cooperative  Complications: No apparent anesthesia complications

## 2015-01-16 NOTE — Progress Notes (Signed)
Subjective: Postpartum Day 1: Cesarean Delivery Patient reports tolerating PO, + flatus and no problems voiding.    Objective: Vital signs in last 24 hours: Temp:  [96.3 F (35.7 C)-98.4 F (36.9 C)] 98.1 F (36.7 C) (05/03 0300) Pulse Rate:  [49-109] 63 (05/03 0300) Resp:  [16-20] 18 (05/03 0300) BP: (110-124)/(59-79) 116/59 mmHg (05/03 0300) SpO2:  [98 %-100 %] 100 % (05/03 0300) Weight:  [96.163 kg (212 lb)] 96.163 kg (212 lb) (05/02 0915)  Physical Exam:  General: alert and no distress Lochia: appropriate Uterine Fundus: firm Incision: clean/dry/intact DVT Evaluation: No cords or calf tenderness.   Recent Labs  01/16/15 0550  HGB 11.4*  HCT 33.1*    Assessment/Plan: Status post Cesarean section. Doing well postoperatively.  Continue current care.  Rebecca Freeman, Daphine DeutscherMARTIN 01/16/2015, 7:36 AM

## 2015-01-17 MED ORDER — ASPIRIN EC 81 MG PO TBEC: 81.0000 mg | DELAYED_RELEASE_TABLET | Freq: Every day | ORAL | Status: DC

## 2015-01-17 MED ORDER — SENNOSIDES-DOCUSATE SODIUM 8.6-50 MG PO TABS
2.0000 | ORAL_TABLET | ORAL | Status: DC
  Administered 2015-01-17: 2 via ORAL
  Filled 2015-01-17: qty 2

## 2015-01-17 MED ORDER — SIMETHICONE 80 MG PO CHEW
80.0000 mg | CHEWABLE_TABLET | ORAL | Status: DC
  Administered 2015-01-17 – 2015-01-18 (×2): 80 mg via ORAL
  Filled 2015-01-17: qty 1

## 2015-01-17 MED ORDER — LABETALOL HCL 200 MG PO TABS
200.0000 mg | ORAL_TABLET | Freq: Two times a day (BID) | ORAL | Status: DC
  Administered 2015-01-17 – 2015-01-18 (×3): 200 mg via ORAL
  Filled 2015-01-17 (×3): qty 1

## 2015-01-17 MED ORDER — HYDROCHLOROTHIAZIDE 25 MG PO TABS
25.0000 mg | ORAL_TABLET | Freq: Every day | ORAL | Status: DC
Start: 2015-01-17 — End: 2015-01-18
  Administered 2015-01-17 – 2015-01-18 (×2): 25 mg via ORAL
  Filled 2015-01-17 (×2): qty 1

## 2015-01-17 NOTE — Addendum Note (Signed)
Addendum  created 01/17/15 1219 by Linward NatalAmy Shanitra Phillippi, MD   Modules edited: Anesthesia Attestations

## 2015-01-17 NOTE — Anesthesia Postprocedure Evaluation (Cosign Needed)
  Anesthesia Post-op Note  Patient: Rebecca Freeman  Procedure(s) Performed: Procedure(s): REPEAT CESAREAN SECTION AND BILATERAL TUBAL LIGATION  (N/A)  Anesthesia type:Spinal  Patient location: PACU  Post pain: Pain level controlled  Post assessment: Post-op Vital signs reviewed, Patient's Cardiovascular Status Stable, Respiratory Function Stable, Patent Airway and No signs of Nausea or vomiting  Post vital signs: Reviewed and stable  Last Vitals:  Filed Vitals:   01/17/15 0435  BP:   Pulse:   Temp: 36.7 C  Resp:     Level of consciousness: awake, alert  and patient cooperative  Complications: No apparent anesthesia complications  

## 2015-01-17 NOTE — Progress Notes (Signed)
Subjective: Postpartum Day 2: Cesarean Delivery Patient reports tolerating PO.    Objective: Vital signs in last 24 hours: Temp:  [97.5 F (36.4 C)-98.4 F (36.9 C)] 98.1 F (36.7 C) (05/04 0435) Pulse Rate:  [65-90] 79 (05/03 2336) Resp:  [18] 18 (05/03 2336) BP: (116-140)/(75-86) 130/75 mmHg (05/03 2336) SpO2:  [100 %] 100 % (05/03 2336)  Physical Exam:  General: alert Lochia: appropriate Uterine Fundus: firm Incision: healing well DVT Evaluation: No evidence of DVT seen on physical exam.   Recent Labs  01/16/15 0550  HGB 11.4*  HCT 33.1*    Assessment/Plan: Status post Cesarean section. Doing well postoperatively.  Continue current care.  Tijah Hane, Daphine DeutscherMARTIN 01/17/2015, 7:42 AM

## 2015-01-17 NOTE — Progress Notes (Signed)
Vs stable; taking 2 BP meds (labetalol and HCTZ); up ad lib; taking motrin and percocet for pain control; breastfeeding well with no assistance needed from RN; husband is supportive

## 2015-01-17 NOTE — Addendum Note (Signed)
Addendum  created 01/17/15 0946 by Malva Coganatherine Wilber Fini, CRNA   Modules edited: Notes Section   Notes Section:  File: [id number]; File: [id number]; File: [id number]

## 2015-01-17 NOTE — Anesthesia Postprocedure Evaluation (Signed)
  Anesthesia Post-op Note  Patient: Rebecca Freeman  Procedure(s) Performed: Procedure(s): REPEAT CESAREAN SECTION AND BILATERAL TUBAL LIGATION  (N/A)  Anesthesia type:Spinal  Patient location: PACU  Post pain: Pain level controlled  Post assessment: Post-op Vital signs reviewed, Patient's Cardiovascular Status Stable, Respiratory Function Stable, Patent Airway and No signs of Nausea or vomiting  Post vital signs: Reviewed and stable  Last Vitals:  Filed Vitals:   01/17/15 0435  BP:   Pulse:   Temp: 36.7 C  Resp:     Level of consciousness: awake, alert  and patient cooperative  Complications: No apparent anesthesia complications

## 2015-01-18 LAB — SURGICAL PATHOLOGY

## 2015-01-18 MED ORDER — IBUPROFEN 800 MG PO TABS: 800.0000 mg | ORAL_TABLET | Freq: Three times a day (TID) | ORAL | Status: DC

## 2015-01-18 MED ORDER — OXYCODONE-ACETAMINOPHEN 5-325 MG PO TABS: 1.0000 | ORAL_TABLET | ORAL | Status: DC | PRN

## 2015-01-18 NOTE — Progress Notes (Signed)
Pt discharged at this time; going home with husband, family and new baby

## 2015-01-18 NOTE — Discharge Instructions (Signed)
You may shower; when in the shower get your hands soapy and pat incision; let the shower water rinse the incision; pat dry  For 6 weeks:  No tampons, douches, enemas or sexual intercourse For 6 weeks:  No heavy lifting or strenuous activity    Call doctor or return to ER:  Temperature of 100.4 or higher; increased abdominal pain; increased vaginal bleeding (passing blood clots the size of your fist or larger OR having to change your pad every hour because it's saturated); chest pain or difficulty breathing; any concerns about postpartum depression (not wanting to see your husband or family, not wanting to see or care for the baby, not having your normal appetite, not having your normal emotions); or for any other concerns

## 2015-01-19 ENCOUNTER — Encounter: Payer: Self-pay | Admitting: Obstetrics and Gynecology

## 2015-01-24 NOTE — Discharge Summary (Signed)
Physician Obstetric Discharge Summary  Patient ID: Rebecca Freeman MRN: [id number] DOB/AGE: 33/09/1980 33 y.o.   Date of Admission: 01/15/2015  Date of Discharge: 01/18/2015  Admitting Diagnosis: Scheduled cesarean section at Unknown  Secondary Diagnosis: Chronic hypertension  and Prior C/S; DES  Mode of Delivery: repeat cesarean section and tubal ligation was performedlow uterine, transverse     Discharge Diagnosis: Reasons for cesarean section  Elective repeat and DES   Intrapartum Procedures: N/A   Post partum procedures: none  Complications: none    (Cesarean Section): Rebecca Freeman is a [id number] who underwent cesarean section with BPS on 01/15/2015.  Patient had an uncomplicated surgery; for further details of this surgery, please refer to the operative note.  Patient had an uncomplicated postpartum course.  By time of discharge on POD#2/PPD#2, her pain was controlled on oral pain medications; she had appropriate lochia and was ambulating, voiding without difficulty, tolerating regular diet and passing flatus.   She was deemed stable for discharge to home.    Labs: CBC Latest Ref Rng 01/16/2015 01/12/2015  WBC 3.6 - 11.0 K/uL 8.6 7.9  Hemoglobin 12.0 - 16.0 g/dL 11.4(L) 12.3  Hematocrit 35.0 - 47.0 % 33.1(L) 36.8  Platelets 150 - 440 K/uL 138(L) 174   A  Physical exam:  Blood pressure 117/73, pulse 64, temperature 98.9 F (37.2 C), temperature source Oral, resp. rate 18, height 5\' 1"  (1.549 m), weight 96.163 kg (212 lb), SpO2 98 %, unknown if currently breastfeeding. General: alert and no distress Lochia: appropriate Abdomen: soft, NT Uterine Fundus: firm LTC Incision: healing well, no significant drainage, no dehiscence, no significant erythema Extremities: No evidence of DVT seen on physical exam. No lower extremity edema.  Discharge Instructions: Per After Visit Summary. Activity: Advance as tolerated. Pelvic rest for 6 weeks.  Also refer to After Visit Summary Diet:  Regular Medications:   Medication List    TAKE these medications        hydrochlorothiazide 25 MG tablet  Commonly known as:  HYDRODIURIL  Take 25 mg by mouth daily.     ibuprofen 800 MG tablet  Commonly known as:  ADVIL,MOTRIN  Take 1 tablet (800 mg total) by mouth every 8 (eight) hours.     labetalol 200 MG tablet  Commonly known as:  NORMODYNE  Take 200 mg by mouth 2 (two) times daily.     oxyCODONE-acetaminophen 5-325 MG per tablet  Commonly known as:  PERCOCET/ROXICET  Take 1-2 tablets by mouth every 4 (four) hours as needed (for pain scale greater than 7).     prenatal multivitamin Tabs tablet  Take 1 tablet by mouth daily.       Outpatient follow up:  Postpartum contraception: bilateral tubal ligation  Discharged Condition: good  Discharged to: home   Newborn Data: Disposition:home with mother  Apgars: APGAR (1 MIN): 7   APGAR (5 MINS): 9   APGAR (10 MINS):    Baby Feeding: Breast  Bessye Stith, Daphine DeutscherMARTIN, MD

## 2015-02-15 ENCOUNTER — Encounter: Payer: Self-pay | Admitting: Obstetrics and Gynecology

## 2015-02-15 ENCOUNTER — Telehealth: Payer: Self-pay

## 2015-02-15 ENCOUNTER — Ambulatory Visit (INDEPENDENT_AMBULATORY_CARE_PROVIDER_SITE_OTHER): Payer: BC Managed Care – PPO | Admitting: Obstetrics and Gynecology

## 2015-02-15 VITALS — BP 106/72 | HR 86 | Temp 97.8°F | Ht 61.0 in | Wt 190.0 lb

## 2015-02-15 DIAGNOSIS — Z4889 Encounter for other specified surgical aftercare: Secondary | ICD-10-CM

## 2015-02-15 DIAGNOSIS — J309 Allergic rhinitis, unspecified: Secondary | ICD-10-CM

## 2015-02-15 MED ORDER — LORATADINE 10 MG PO TABS: 10.0000 mg | ORAL_TABLET | Freq: Every day | ORAL | Status: DC

## 2015-02-15 NOTE — Patient Instructions (Signed)
PLAN: 1.  Monitor for evidence of infection including development of erythema, induration, or pus drainage. 2.  Clean incision with half and half normal saline/hydroperoxide twice a day when necessary 3.  RTC as scheduled for her 6 week postoperative check.

## 2015-02-15 NOTE — Progress Notes (Signed)
Chief complaint: 1.  Postop wound check. 2.  Status post repeat cesarean section 01/17/2015  SUBJECTIVE: Patient presents for wound check.  Patient noticed drainage from her incision, and 2 small spots on the left lateral aspect of her incision.  She denies fevers, chills or sweats.  OBJECTIVE: BP 106/72 mmHg  Pulse 86  Temp(Src) 97.8 F (36.6 C)  Ht 5\' 1"  (1.549 m)  Wt 190 lb (86.183 kg)  BMI 35.92 kg/m2  General: Well-developed, well-nourished white female in no acute distress.  Alert and oriented.  Abdomen is appropriate. Skin: No rash. Abdomen: Pfannenstiel incision is well approximated.  There are 25 mm defects noted on the left aspect of her incision with minimal serous drainage.  No fluctuance, no erythema, no induration.  IMPRESSION: 1.  Healing Pfannenstiel incision with minimal superficial skin separation likely related to suture degradation.  PLAN: 1.  Monitor for evidence of infection including development of erythema, induration, or pus drainage. 2.  Clean incision with half and half normal saline/hydroperoxide twice a day when necessary 3.  RTC as scheduled for her 6 week postoperative check.

## 2015-02-28 IMAGING — CR DG CHEST 2V
1 series · 2 of 2 positions shown · non-contrast
Comparison: none

REASON FOR EXAM: fever of unknown origin
COMMENTS:

PROCEDURE:     KDR - KDXR CHEST PA (OR AP) AND LAT  - July 13, 2013  [DATE]
RESULT:     The lungs are clear. The heart and pulmonary vessels are normal.
The bony and mediastinal structures are unremarkable. There is no effusion.
There is no pneumothorax or evidence of congestive failure.

[Series 1: pa · 0.17mm/px · 2 of 2 slices shown]
[im 1/2]
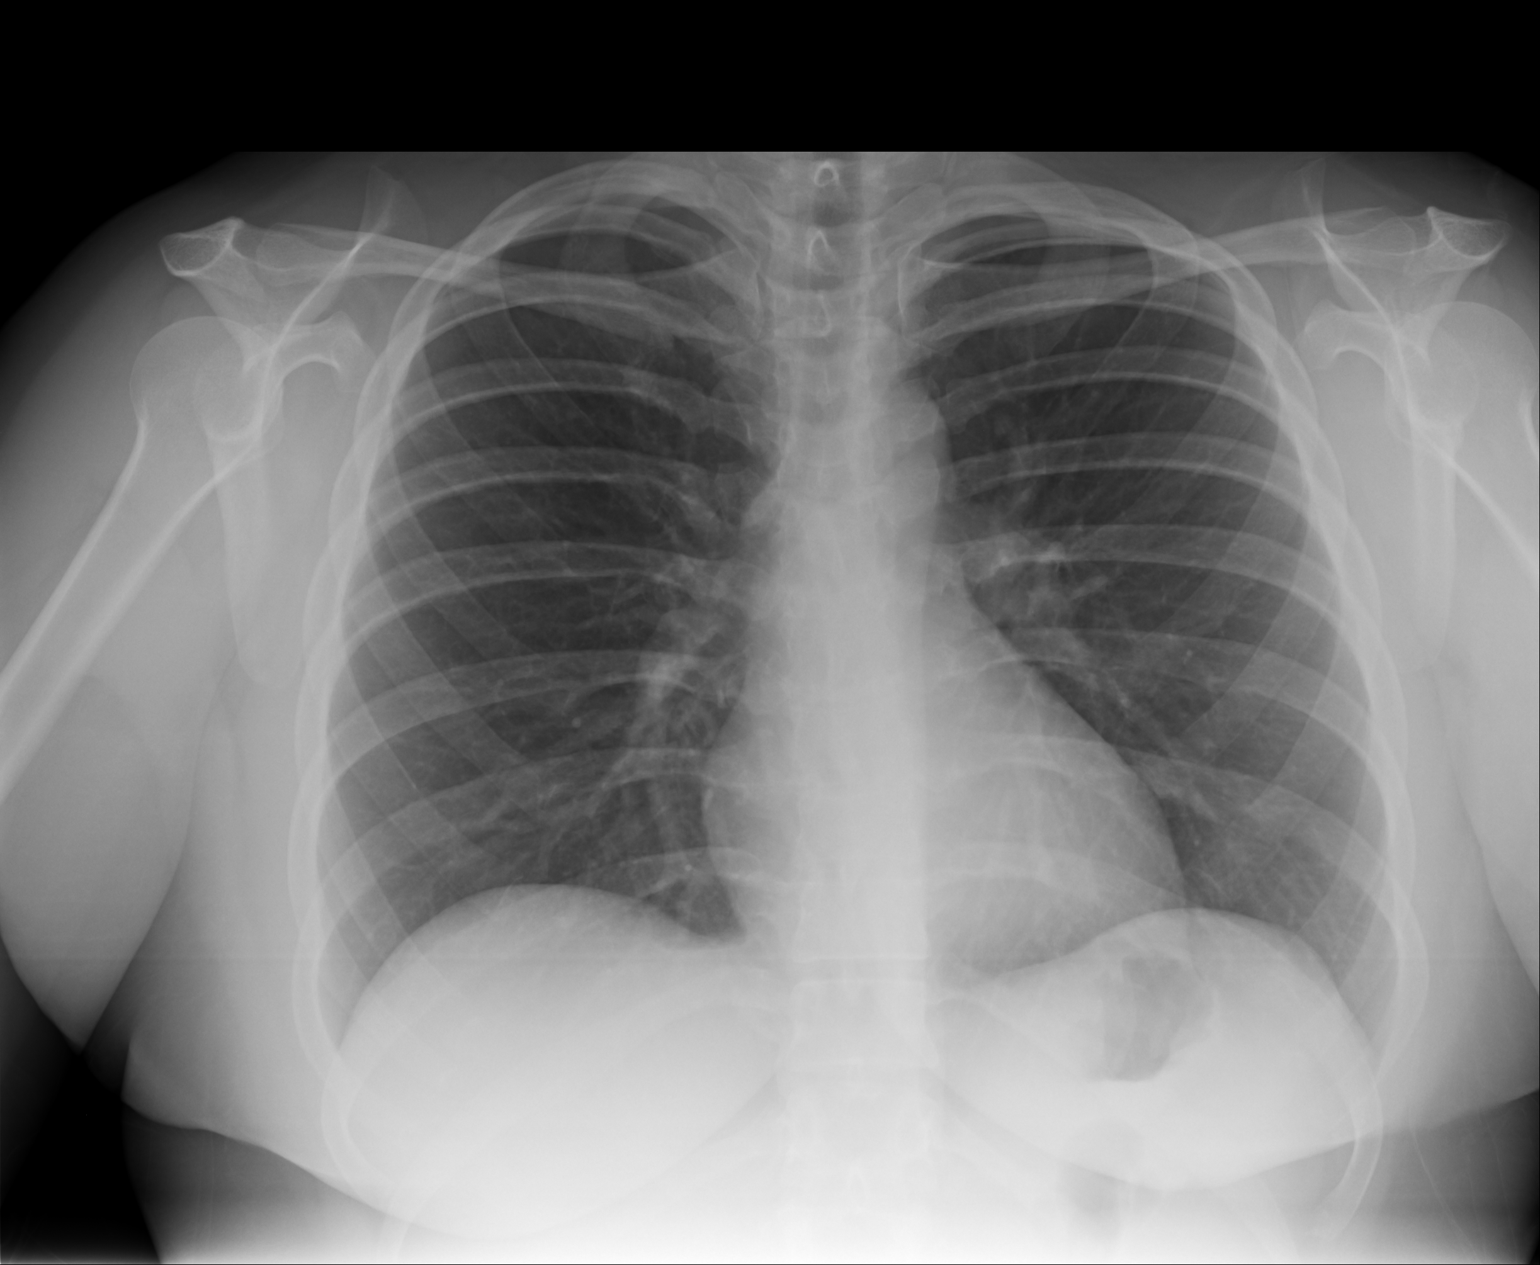
[im 2/2]
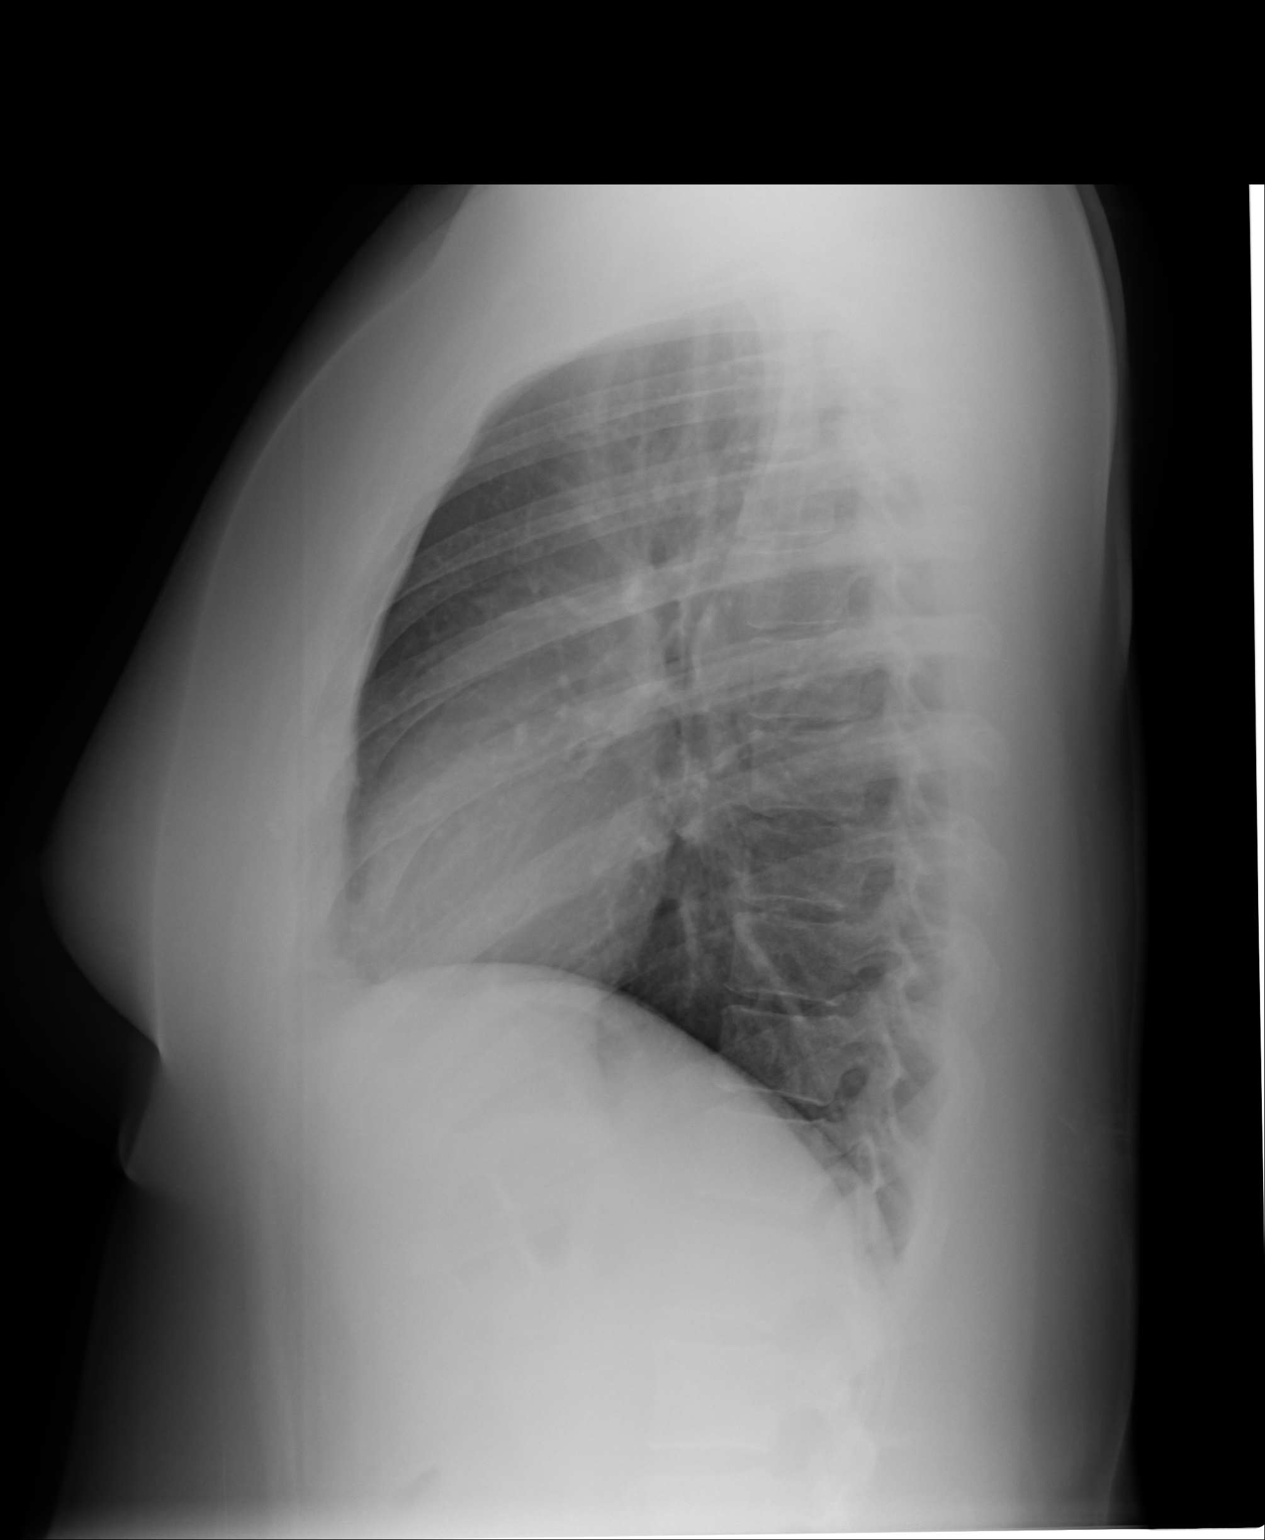

[2 of 2 positions shown; findings below may reference images not displayed]

IMPRESSION: No acute cardiopulmonary disease.

[REDACTED]

## 2015-03-01 ENCOUNTER — Ambulatory Visit (INDEPENDENT_AMBULATORY_CARE_PROVIDER_SITE_OTHER): Payer: BC Managed Care – PPO | Admitting: Obstetrics and Gynecology

## 2015-03-01 ENCOUNTER — Encounter: Payer: Self-pay | Admitting: Obstetrics and Gynecology

## 2015-03-01 DIAGNOSIS — I10 Essential (primary) hypertension: Secondary | ICD-10-CM | POA: Insufficient documentation

## 2015-03-01 DIAGNOSIS — E669 Obesity, unspecified: Secondary | ICD-10-CM | POA: Insufficient documentation

## 2015-03-01 DIAGNOSIS — R638 Other symptoms and signs concerning food and fluid intake: Secondary | ICD-10-CM

## 2015-03-01 NOTE — Progress Notes (Signed)
Patient ID: Rebecca Freeman, female   DOB: Jul 31, 1981, 34 y.o.   MRN: [id number] Subjective:    Rebecca Freeman is a 34 y.o. G69P1021 Caucasian female who presents for a postpartum visit. She is 6 weeks postpartum following a repeat cesarean section, low transverse incision at 39 gestational weeks. Anesthesia: spinal. I have fully reviewed the prenatal and intrapartum course. Postpartum course has been non-eventful. Baby's course has been non-eventful. Baby is feeding by both breast and bottle - Similac with Iron. Bleeding had stopped for a day or two, started back-brownish, no odor. Bowel function is normal. Bladder function is normal. Patient is not sexually active. Last sexual activity: na. Contraception method is tubal ligation. Postpartum depression screening: negative. Score 0..  Last pap 06/08/2014 and was negative.  The following portions of the patient's history were reviewed and updated as appropriate: allergies, current medications, past medical history, past surgical history and problem list.  Review of Systems Pertinent items are noted in HPI.   B/P-116/76. P-50, WT. 190 LB. No lmp since delivery.  Objective:   General:  alert, cooperative and no distress   Breasts:  deferred, no complaints  Lungs: clear to auscultation bilaterally  Heart:  regular rate and rhythm  Abdomen: soft, nontender   Vulva: Normal  Vagina: normal vagina; atrophic changes  Cervix:  closed  Corpus: Well-involuted  Adnexa:  Non-palpable  Rectal Exam: No  hemorrhoids        Assessment:   Postpartum exam 6wks s/p Repeat C/S with BPS Breastfeeding Depression screening - negative Contraception counseling   Plan:   Contraception: tubal ligation Follow up in: 6 months for Annual Exam or earlier if needed. Follow up with Dr. Elease Hashimoto for HTN Management and F/U.  Herold Harms, MD   03/01/2015 9:18 AM

## 2015-04-18 ENCOUNTER — Telehealth: Payer: Self-pay | Admitting: Obstetrics and Gynecology

## 2015-04-18 NOTE — Telephone Encounter (Signed)
PT CAME IN AND PER HER JOB NEEDS A LETTER STATING THAT SHE IS RELEASED TO GO BACK TO WORK WITH DR DE'S [place]. SHE GOES BACK TO WORK ON 05/07/15. SHE WOULD LIKE FOR [place] TO FAX IT TO Devoria Albe WITH [facility] FAX # [phone].

## 2015-04-23 NOTE — Telephone Encounter (Signed)
Pt aware letter printed and awaiting on mads signature. Will have him sign next week and then faxed.

## 2015-08-30 ENCOUNTER — Encounter: Payer: BC Managed Care – PPO | Admitting: Obstetrics and Gynecology

## 2015-09-12 ENCOUNTER — Other Ambulatory Visit: Payer: Self-pay

## 2015-09-12 MED ORDER — CONCEPT DHA 53.5-38-1 MG PO CAPS: 53.5000 mg | ORAL_CAPSULE | Freq: Every day | ORAL | Status: DC

## 2015-09-26 ENCOUNTER — Other Ambulatory Visit: Payer: Self-pay | Admitting: Family Medicine

## 2015-09-26 DIAGNOSIS — J309 Allergic rhinitis, unspecified: Secondary | ICD-10-CM

## 2015-09-26 NOTE — Telephone Encounter (Signed)
Last OV 07/20/2013  Thanks,   -Vernona RiegerLaura

## 2015-11-07 ENCOUNTER — Other Ambulatory Visit: Payer: Self-pay

## 2015-11-07 DIAGNOSIS — I10 Essential (primary) hypertension: Secondary | ICD-10-CM

## 2015-11-07 MED ORDER — LABETALOL HCL 200 MG PO TABS: 200.0000 mg | ORAL_TABLET | Freq: Two times a day (BID) | ORAL | Status: DC

## 2015-11-08 ENCOUNTER — Ambulatory Visit (INDEPENDENT_AMBULATORY_CARE_PROVIDER_SITE_OTHER): Payer: BC Managed Care – PPO | Admitting: Family Medicine

## 2015-11-08 ENCOUNTER — Other Ambulatory Visit: Payer: Self-pay

## 2015-11-08 ENCOUNTER — Encounter: Payer: Self-pay | Admitting: Family Medicine

## 2015-11-08 VITALS — BP 122/86 | HR 80 | Temp 98.5°F | Resp 16 | Ht 61.0 in | Wt 202.0 lb

## 2015-11-08 DIAGNOSIS — E038 Other specified hypothyroidism: Secondary | ICD-10-CM | POA: Insufficient documentation

## 2015-11-08 DIAGNOSIS — E559 Vitamin D deficiency, unspecified: Secondary | ICD-10-CM | POA: Insufficient documentation

## 2015-11-08 DIAGNOSIS — I10 Essential (primary) hypertension: Secondary | ICD-10-CM

## 2015-11-08 DIAGNOSIS — J309 Allergic rhinitis, unspecified: Secondary | ICD-10-CM

## 2015-11-08 DIAGNOSIS — E039 Hypothyroidism, unspecified: Secondary | ICD-10-CM | POA: Insufficient documentation

## 2015-11-08 MED ORDER — HYDROCHLOROTHIAZIDE 25 MG PO TABS: 25.0000 mg | ORAL_TABLET | Freq: Every day | ORAL | Status: DC

## 2015-11-08 MED ORDER — LORATADINE 10 MG PO TABS: 10.0000 mg | ORAL_TABLET | Freq: Every day | ORAL | Status: DC

## 2015-11-08 MED ORDER — LABETALOL HCL 200 MG PO TABS: 200.0000 mg | ORAL_TABLET | Freq: Two times a day (BID) | ORAL | Status: DC

## 2015-11-08 NOTE — Progress Notes (Signed)
Patient ID: Rebecca Freeman, female   DOB: 01-19-1981, 35 y.o.   MRN: [id number]         Patient: Rebecca Freeman Female    DOB: December 07, 1980   35 y.o.   MRN: [id number] Visit Date: 11/08/2015  Today's Provider: Lorie Phenix, MD   Chief Complaint  Patient presents with  . Hypertension  . Allergic Rhinitis    Subjective:    HPI   Allergic Rhinitis: Brittannie Tawney Dixson is here for evaluation of possible allergic rhinitis. Patient's symptoms include itchy nose, nasal congestion, postnasal drip and sneezing. These symptoms are seasonal. Current triggers include exposure to no known precipitant. The patient has been suffering from these symptoms for approximately Several  years. The patient has tried over the counter medications and prescription antihistamines with excellent relief of symptoms. Immunotherapy has never been tried. The patient has never had nasal polyps. The patient has no history of asthma. The patient does not suffer from frequent sinopulmonary infections. The patient has not had sinus surgery in the past. The patient has no history of eczema.    Hypertension, follow-up:  BP Readings from Last 3 Encounters:  11/08/15 122/86  03/01/15 116/76  02/15/15 106/72    She was last seen for hypertension 2 years ago.  Management since that visit includes Med changes secondary to pregnancy .She reports excellent compliance with treatment. She is not having side effects.  She is exercising. She is adherent to low salt diet.   Outside blood pressures are 120's/60's. She is experiencing none.  Patient denies chest pain, fatigue, irregular heart beat, palpitations, paroxysmal nocturnal dyspnea, syncope and tachypnea.   Cardiovascular risk factors include none.  Use of agents associated with hypertension: none.   ------------------------------------------------------------------------          Allergies  Allergen Reactions  . Morphine And Related Itching and Anxiety    Previous Medications   AMLODIPINE (NORVASC) 10 MG TABLET    Take by mouth. Reported on 11/08/2015   HYDROCHLOROTHIAZIDE (HYDRODIURIL) 25 MG TABLET    Take 25 mg by mouth daily.   LABETALOL (NORMODYNE) 200 MG TABLET    Take 1 tablet (200 mg total) by mouth 2 (two) times daily.   LORATADINE (CLARITIN) 10 MG TABLET    Take 1 tablet (10 mg total) by mouth daily. Need to schedule an office visit before anymore refills.   PRENATAL VIT-FE FUMARATE-FA (PRENATAL MULTIVITAMIN) TABS TABLET    Take 1 tablet by mouth daily.     Review of Systems  Constitutional: Negative.   HENT: Negative.   Eyes: Negative.   Respiratory: Negative.   Cardiovascular: Negative.   Gastrointestinal: Negative.   Musculoskeletal: Negative.   Allergic/Immunologic: Positive for environmental allergies.  Neurological: Negative for dizziness and light-headedness.    Social History  Substance Use Topics  . Smoking status: Never Smoker   . Smokeless tobacco: Never Used  . Alcohol Use: 0.0 oz/week    0 Standard drinks or equivalent per week   Objective:   BP 122/86 mmHg  Pulse 80  Temp(Src) 98.5 F (36.9 C) (Oral)  Resp 16  Ht  (1.549 m)  Wt 202 lb (91.627 kg)  BMI 38.19 kg/m2  Physical Exam  Constitutional: She is oriented to person, place, and time. She appears well-developed and well-nourished.  Cardiovascular: Normal rate and regular rhythm.   Pulmonary/Chest: Effort normal and breath sounds normal.  Neurological: She is alert and oriented to person, place, and time.  Psychiatric: She has a normal mood  and affect. Her behavior is normal. Judgment and thought content normal.      Assessment & Plan:     1. Chronic hypertension Condition is stable. Please continue current medication and  plan of care as noted.   - hydrochlorothiazide (HYDRODIURIL) 25 MG tablet; Take 1 tablet (25 mg total) by mouth daily.  Dispense: 90 tablet; Refill: 3 - labetalol (NORMODYNE) 200 MG tablet; Take 1 tablet (200 mg  total) by mouth 2 (two) times daily.  Dispense: 180 tablet; Refill: 3  2. Allergic rhinitis, unspecified allergic rhinitis type Condition is stable. Please continue current medication and  plan of care as noted.   - loratadine (CLARITIN) 10 MG tablet; Take 1 tablet (10 mg total) by mouth daily. Need to schedule an office visit before anymore refills.  Dispense: 90 tablet; Refill: 3     Patient was seen and examined by Leo Grosser, MD, and note scribed by Kavin Leech, CMA.  I have reviewed the document for accuracy and completeness and I agree with above. - Leo Grosser, MD   Lorie Phenix, MD  [facility]

## 2015-11-19 ENCOUNTER — Other Ambulatory Visit: Payer: Self-pay | Admitting: Family Medicine

## 2015-11-19 DIAGNOSIS — J309 Allergic rhinitis, unspecified: Secondary | ICD-10-CM

## 2015-11-19 MED ORDER — LORATADINE 10 MG PO TABS: 10.0000 mg | ORAL_TABLET | Freq: Every day | ORAL | Status: DC

## 2015-11-19 NOTE — Telephone Encounter (Signed)
Medication below is the wrong med listed. Please refill Loratadine 10mg . Patient was last seen on 11/08/15.

## 2015-11-19 NOTE — Telephone Encounter (Signed)
Pt contacted office for refill request on the following medications:  oxycodone (ROXICODONE) 30 MG immediate release tablet.  [facility]

## 2016-02-29 ENCOUNTER — Other Ambulatory Visit: Payer: Self-pay | Admitting: Family Medicine

## 2016-02-29 DIAGNOSIS — J309 Allergic rhinitis, unspecified: Secondary | ICD-10-CM

## 2016-04-30 DIAGNOSIS — H6693 Otitis media, unspecified, bilateral: Secondary | ICD-10-CM | POA: Diagnosis not present

## 2016-05-12 NOTE — Progress Notes (Deleted)
ANNUAL PREVENTATIVE CARE GYN  ENCOUNTER NOTE  Subjective:       Rebecca Freeman is a 35 y.o. G33P1021 female here for a routine annual gynecologic exam.  Current complaints: 1.      Gynecologic History No LMP recorded. Contraception: tubal ligation Last Pap: 05/2014 neg. Results were: normal Last mammogram: n/a. Results were: n/a  Obstetric History OB History  Gravida Para Term Preterm AB Living  4 2 1  0 2 1  SAB TAB Ectopic Multiple Live Births  2 0 0 0 1    # Outcome Date GA Lbr Len/2nd Weight Sex Delivery Anes PTL Lv  4 Para 01/15/15   6 lb 9.1 oz (2.98 kg) F CS-LTranv Spinal  LIV  3 SAB           2 SAB           1 Term               Past Medical History:  Diagnosis Date  . Anemia   . Anemia   . Hematuria   . Hypertension   . Hypertension   . PONV (postoperative nausea and vomiting)     Past Surgical History:  Procedure Laterality Date  . bilateral clubfoot surgery Bilateral   . CESAREAN SECTION    . CESAREAN SECTION N/A 01/15/2015   Procedure: REPEAT CESAREAN SECTION AND BILATERAL TUBAL LIGATION ;  Surgeon: Herold Harms, MD;  Location: [facility] ORS;  Service: Obstetrics;  Laterality: N/A;    Current Outpatient Prescriptions on File Prior to Visit  Medication Sig Dispense Refill  . amLODipine (NORVASC) 10 MG tablet Take by mouth. Reported on 11/08/2015    . hydrochlorothiazide (HYDRODIURIL) 25 MG tablet Take 1 tablet (25 mg total) by mouth daily. 90 tablet 3  . labetalol (NORMODYNE) 200 MG tablet Take 1 tablet (200 mg total) by mouth 2 (two) times daily. 180 tablet 3  . loratadine (CLARITIN) 10 MG tablet Take 1 tablet (10 mg total) by mouth daily. 90 tablet 1  . Prenatal Vit-Fe Fumarate-FA (PRENATAL MULTIVITAMIN) TABS tablet Take 1 tablet by mouth daily.      No current facility-administered medications on file prior to visit.     Allergies  Allergen Reactions  . Morphine And Related Itching and Anxiety    Social History   Social History  . Marital  status: Married    Spouse name: N/A  . Number of children: N/A  . 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 Smoking status: Never Smoker  . Smokeless tobacco: Never Used  . Alcohol use 0.0 oz/week  . Drug use: No  . Sexual activity: Not Currently    Partners: Male   Other Topics Concern  . Not on file   Social History Narrative  . No narrative on file    Family History  Problem Relation Age of Onset  . Club foot Son     BILATERAL  . Healthy Mother   . Healthy Father   . Healthy Brother     The following portions of the patient's history were reviewed and updated as appropriate: allergies, current medications, past family history, past medical history, past social history, past surgical history and problem list.  Review of Systems ROS Review of Systems - General ROS: negative for - chills, fatigue, fever, hot flashes, night sweats, weight gain or weight loss Psychological ROS: negative for - anxiety, decreased libido, depression, mood  swings, physical abuse or sexual abuse Ophthalmic ROS: negative for - blurry vision, eye pain or loss of vision ENT ROS: negative for - headaches, hearing change, visual changes or vocal changes Allergy and Immunology ROS: negative for - hives, itchy/watery eyes or seasonal allergies Hematological and Lymphatic ROS: negative for - bleeding problems, bruising, swollen lymph nodes or weight loss Endocrine ROS: negative for - galactorrhea, hair pattern changes, hot flashes, malaise/lethargy, mood swings, palpitations, polydipsia/polyuria, skin changes, temperature intolerance or unexpected weight changes Breast ROS: negative for - new or changing breast lumps or nipple discharge Respiratory ROS: negative for - cough or shortness of breath Cardiovascular ROS: negative for - chest pain, irregular heartbeat, palpitations or shortness of breath Gastrointestinal ROS: no abdominal pain, change  in bowel habits, or black or bloody stools Genito-Urinary ROS: no dysuria, trouble voiding, or hematuria Musculoskeletal ROS: negative for - joint pain or joint stiffness Neurological ROS: negative for - bowel and bladder control changes Dermatological ROS: negative for rash and skin lesion changes   Objective:   There were no vitals taken for this visit. CONSTITUTIONAL: Well-developed, well-nourished female in no acute distress.  PSYCHIATRIC: Normal mood and affect. Normal behavior. Normal judgment and thought content. NEUROLGIC: Alert and oriented to person, place, and time. Normal muscle tone coordination. No cranial nerve deficit noted. HENT:  Normocephalic, atraumatic, External right and left ear normal. Oropharynx is clear and moist EYES: Conjunctivae and EOM are normal. Pupils are equal, round, and reactive to light. No scleral icterus.  NECK: Normal range of motion, supple, no masses.  Normal thyroid.  SKIN: Skin is warm and dry. No rash noted. Not diaphoretic. No erythema. No pallor. CARDIOVASCULAR: Normal heart rate noted, regular rhythm, no murmur. RESPIRATORY: Clear to auscultation bilaterally. Effort and breath sounds normal, no problems with respiration noted. BREASTS: Symmetric in size. No masses, skin changes, nipple drainage, or lymphadenopathy. ABDOMEN: Soft, normal bowel sounds, no distention noted.  No tenderness, rebound or guarding.  BLADDER: Normal PELVIC:  External Genitalia: Normal  BUS: Normal  Vagina: Normal  Cervix: Normal  Uterus: Normal  Adnexa: Normal  RV: {Blank multiple:19196::"External Exam NormaI","No Rectal Masses","Normal Sphincter tone"}  MUSCULOSKELETAL: Normal range of motion. No tenderness.  No cyanosis, clubbing, or edema.  2+ distal pulses. LYMPHATIC: No Axillary, Supraclavicular, or Inguinal Adenopathy.    Assessment:   Annual gynecologic examination 35 y.o. Contraception: tubal ligation [id number] Problem List Items Addressed This Visit     Chronic hypertension   Increased BMI    Other Visit Diagnoses    Well woman exam with routine gynecological exam    -  Primary      Plan:  Pap: Pap Co Test Mammogram: due age 89 Stool Guaiac Testing:  Not Indicated Labs: vit d tsh a1c fbs a1c Routine preventative health maintenance measures emphasized: {Blank multiple:19196::"Exercise/Diet/Weight control","Tobacco Warnings","Alcohol/Substance use risks","Stress Management","Peer Pressure Issues","Safe Sex"} *** Return to Clinic - [address]   Jonica Bickhart RaynesfordMiller, [place]MA

## 2016-05-15 ENCOUNTER — Encounter: Payer: BC Managed Care – PPO | Admitting: Obstetrics and Gynecology

## 2016-06-23 NOTE — Progress Notes (Deleted)
ANNUAL PREVENTATIVE CARE GYN  ENCOUNTER NOTE  Subjective:       Rebecca Freeman is a 35 y.o. [id number] female here for a routine annual gynecologic exam.  Current complaints: 1.      Gynecologic History No LMP recorded. Contraception: tubal ligation Last Pap: 2015 neg. Results were: normal Last mammogram: n/a. Results were: Marland Kitchen.  Obstetric History OB History  Gravida Para Term Preterm AB Living  4 2 1  0 2 1  SAB TAB Ectopic Multiple Live Births  2 0 0 0 1    # Outcome Date GA Lbr Len/2nd Weight Sex Delivery Anes PTL Lv  4 Para 01/15/15   6 lb 9.1 oz (2.98 kg) F CS-LTranv Spinal  LIV  3 SAB           2 SAB           1 Term               Past Medical History:  Diagnosis Date  . Anemia   . Anemia   . Hematuria   . Hypertension   . Hypertension   . PONV (postoperative nausea and vomiting)     Past Surgical History:  Procedure Laterality Date  . bilateral clubfoot surgery Bilateral   . CESAREAN SECTION    . CESAREAN SECTION N/A 01/15/2015   Procedure: REPEAT CESAREAN SECTION AND BILATERAL TUBAL LIGATION ;  Surgeon: Herold HarmsMartin A Defrancesco, MD;  Location: [facility] ORS;  Service: Obstetrics;  Laterality: N/A;    Current Outpatient Prescriptions on File Prior to Visit  Medication Sig Dispense Refill  . amLODipine (NORVASC) 10 MG tablet Take by mouth. Reported on 11/08/2015    . hydrochlorothiazide (HYDRODIURIL) 25 MG tablet Take 1 tablet (25 mg total) by mouth daily. 90 tablet 3  . labetalol (NORMODYNE) 200 MG tablet Take 1 tablet (200 mg total) by mouth 2 (two) times daily. 180 tablet 3  . loratadine (CLARITIN) 10 MG tablet Take 1 tablet (10 mg total) by mouth daily. 90 tablet 1  . Prenatal Vit-Fe Fumarate-FA (PRENATAL MULTIVITAMIN) TABS tablet Take 1 tablet by mouth daily.      No current facility-administered medications on file prior to visit.     Allergies  Allergen Reactions  . Morphine And Related Itching and Anxiety    Social History   Social History  . Marital  status: Married    Spouse name: N/A  . Number of children: N/A  . 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 Smoking status: Never Smoker  . Smokeless tobacco: Never Used  . Alcohol use 0.0 oz/week  . Drug use: No  . Sexual activity: Not Currently    Partners: Male   Other Topics Concern  . Not on file   Social History Narrative  . No narrative on file    Family History  Problem Relation Age of Onset  . Club foot Son     BILATERAL  . Healthy Mother   . Healthy Father   . Healthy Brother     The following portions of the patient's history were reviewed and updated as appropriate: allergies, current medications, past family history, past medical history, past social history, past surgical history and problem list.  Review of Systems ROS Review of Systems - General ROS: negative for - chills, fatigue, fever, hot flashes, night sweats, weight gain or weight loss Psychological ROS: negative for - anxiety, decreased libido, depression, mood  swings, physical abuse or sexual abuse Ophthalmic ROS: negative for - blurry vision, eye pain or loss of vision ENT ROS: negative for - headaches, hearing change, visual changes or vocal changes Allergy and Immunology ROS: negative for - hives, itchy/watery eyes or seasonal allergies Hematological and Lymphatic ROS: negative for - bleeding problems, bruising, swollen lymph nodes or weight loss Endocrine ROS: negative for - galactorrhea, hair pattern changes, hot flashes, malaise/lethargy, mood swings, palpitations, polydipsia/polyuria, skin changes, temperature intolerance or unexpected weight changes Breast ROS: negative for - new or changing breast lumps or nipple discharge Respiratory ROS: negative for - cough or shortness of breath Cardiovascular ROS: negative for - chest pain, irregular heartbeat, palpitations or shortness of breath Gastrointestinal ROS: no abdominal pain, change  in bowel habits, or black or bloody stools Genito-Urinary ROS: no dysuria, trouble voiding, or hematuria Musculoskeletal ROS: negative for - joint pain or joint stiffness Neurological ROS: negative for - bowel and bladder control changes Dermatological ROS: negative for rash and skin lesion changes   Objective:   There were no vitals taken for this visit. CONSTITUTIONAL: Well-developed, well-nourished female in no acute distress.  PSYCHIATRIC: Normal mood and affect. Normal behavior. Normal judgment and thought content. NEUROLGIC: Alert and oriented to person, place, and time. Normal muscle tone coordination. No cranial nerve deficit noted. HENT:  Normocephalic, atraumatic, External right and left ear normal. Oropharynx is clear and moist EYES: Conjunctivae and EOM are normal. Pupils are equal, round, and reactive to light. No scleral icterus.  NECK: Normal range of motion, supple, no masses.  Normal thyroid.  SKIN: Skin is warm and dry. No rash noted. Not diaphoretic. No erythema. No pallor. CARDIOVASCULAR: Normal heart rate noted, regular rhythm, no murmur. RESPIRATORY: Clear to auscultation bilaterally. Effort and breath sounds normal, no problems with respiration noted. BREASTS: Symmetric in size. No masses, skin changes, nipple drainage, or lymphadenopathy. ABDOMEN: Soft, normal bowel sounds, no distention noted.  No tenderness, rebound or guarding.  BLADDER: Normal PELVIC:  External Genitalia: Normal  BUS: Normal  Vagina: Normal  Cervix: Normal  Uterus: Normal  Adnexa: Normal  RV: {Blank multiple:19196::"External Exam NormaI","No Rectal Masses","Normal Sphincter tone"}  MUSCULOSKELETAL: Normal range of motion. No tenderness.  No cyanosis, clubbing, or edema.  2+ distal pulses. LYMPHATIC: No Axillary, Supraclavicular, or Inguinal Adenopathy.    Assessment:   Annual gynecologic examination 35 y.o. Contraception: tubal ligation [id number] Problem List Items Addressed This Visit     Chronic hypertension   Increased BMI    Other Visit Diagnoses    Well woman exam with routine gynecological exam    -  Primary      Plan:  Pap: Pap Co Test Mammogram: Not Indicated Stool Guaiac Testing:  Not Indicated Labs: thru pcp Routine preventative health maintenance measures emphasized: {Blank multiple:19196::"Exercise/Diet/Weight control","Tobacco Warnings","Alcohol/Substance use risks","Stress Management","Peer Pressure Issues","Safe Sex"} *** Return to Clinic - 1 [address] [place], [place]

## 2016-06-24 ENCOUNTER — Encounter: Payer: BC Managed Care – PPO | Admitting: Obstetrics and Gynecology

## 2016-07-28 NOTE — Progress Notes (Deleted)
ANNUAL PREVENTATIVE CARE GYN  ENCOUNTER NOTE  Subjective:       Rebecca Freeman is a 35 y.o. [id number] female here for a routine annual gynecologic exam.  Current complaints: 1.    Gynecologic History No LMP recorded. Contraception: tubal ligation Last Pap: 2015 neg. Results were: normal Last mammogram: n/a. Results were:   Obstetric History OB History  Gravida Para Term Preterm AB Living  4 2 1  0 2 1  SAB TAB Ectopic Multiple Live Births  2 0 0 0 1    # Outcome Date GA Lbr Len/2nd Weight Sex Delivery Anes PTL Lv  4 Para 01/15/15   6 lb 9.1 oz (2.98 kg) F CS-LTranv Spinal  LIV  3 SAB           2 SAB           1 Term               Past Medical History:  Diagnosis Date  . Anemia   . Anemia   . Hematuria   . Hypertension   . Hypertension   . PONV (postoperative nausea and vomiting)     Past Surgical History:  Procedure Laterality Date  . bilateral clubfoot surgery Bilateral   . CESAREAN SECTION    . CESAREAN SECTION N/A 01/15/2015   Procedure: REPEAT CESAREAN SECTION AND BILATERAL TUBAL LIGATION ;  Surgeon: Herold HarmsMartin A Defrancesco, MD;  Location: [facility] ORS;  Service: Obstetrics;  Laterality: N/A;    Current Outpatient Prescriptions on File Prior to Visit  Medication Sig Dispense Refill  . amLODipine (NORVASC) 10 MG tablet Take by mouth. Reported on 11/08/2015    . hydrochlorothiazide (HYDRODIURIL) 25 MG tablet Take 1 tablet (25 mg total) by mouth daily. 90 tablet 3  . labetalol (NORMODYNE) 200 MG tablet Take 1 tablet (200 mg total) by mouth 2 (two) times daily. 180 tablet 3  . loratadine (CLARITIN) 10 MG tablet Take 1 tablet (10 mg total) by mouth daily. 90 tablet 1  . Prenatal Vit-Fe Fumarate-FA (PRENATAL MULTIVITAMIN) TABS tablet Take 1 tablet by mouth daily.      No current facility-administered medications on file prior to visit.     Allergies  Allergen Reactions  . Morphine And Related Itching and Anxiety    Social History   Social History  . Marital status:  Married    Spouse name: N/A  . Number of children: N/A  . 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 Smoking status: Never Smoker  . Smokeless tobacco: Never Used  . Alcohol use 0.0 oz/week  . Drug use: No  . Sexual activity: Not Currently    Partners: Male   Other Topics Concern  . Not on file   Social History Narrative  . No narrative on file    Family History  Problem Relation Age of Onset  . Club foot Son     BILATERAL  . Healthy Mother   . Healthy Father   . Healthy Brother     The following portions of the patient's history were reviewed and updated as appropriate: allergies, current medications, past family history, past medical history, past social history, past surgical history and problem list.  Review of Systems ROS Review of Systems - General ROS: negative for - chills, fatigue, fever, hot flashes, night sweats, weight gain or weight loss Psychological ROS: negative for - anxiety, decreased libido, depression, mood swings, physical  abuse or sexual abuse Ophthalmic ROS: negative for - blurry vision, eye pain or loss of vision ENT ROS: negative for - headaches, hearing change, visual changes or vocal changes Allergy and Immunology ROS: negative for - hives, itchy/watery eyes or seasonal allergies Hematological and Lymphatic ROS: negative for - bleeding problems, bruising, swollen lymph nodes or weight loss Endocrine ROS: negative for - galactorrhea, hair pattern changes, hot flashes, malaise/lethargy, mood swings, palpitations, polydipsia/polyuria, skin changes, temperature intolerance or unexpected weight changes Breast ROS: negative for - new or changing breast lumps or nipple discharge Respiratory ROS: negative for - cough or shortness of breath Cardiovascular ROS: negative for - chest pain, irregular heartbeat, palpitations or shortness of breath Gastrointestinal ROS: no abdominal pain, change in bowel  habits, or black or bloody stools Genito-Urinary ROS: no dysuria, trouble voiding, or hematuria Musculoskeletal ROS: negative for - joint pain or joint stiffness Neurological ROS: negative for - bowel and bladder control changes Dermatological ROS: negative for rash and skin lesion changes   Objective:   There were no vitals taken for this visit. CONSTITUTIONAL: Well-developed, well-nourished female in no acute distress.  PSYCHIATRIC: Normal mood and affect. Normal behavior. Normal judgment and thought content. NEUROLGIC: Alert and oriented to person, place, and time. Normal muscle tone coordination. No cranial nerve deficit noted. HENT:  Normocephalic, atraumatic, External right and left ear normal. Oropharynx is clear and moist EYES: Conjunctivae and EOM are normal. Pupils are equal, round, and reactive to light. No scleral icterus.  NECK: Normal range of motion, supple, no masses.  Normal thyroid.  SKIN: Skin is warm and dry. No rash noted. Not diaphoretic. No erythema. No pallor. CARDIOVASCULAR: Normal heart rate noted, regular rhythm, no murmur. RESPIRATORY: Clear to auscultation bilaterally. Effort and breath sounds normal, no problems with respiration noted. BREASTS: Symmetric in size. No masses, skin changes, nipple drainage, or lymphadenopathy. ABDOMEN: Soft, normal bowel sounds, no distention noted.  No tenderness, rebound or guarding.  BLADDER: Normal PELVIC:  External Genitalia: Normal  BUS: Normal  Vagina: Normal  Cervix: Normal  Uterus: Normal  Adnexa: Normal  RV: {Blank multiple:19196::"External Exam NormaI","No Rectal Masses","Normal Sphincter tone"}  MUSCULOSKELETAL: Normal range of motion. No tenderness.  No cyanosis, clubbing, or edema.  2+ distal pulses. LYMPHATIC: No Axillary, Supraclavicular, or Inguinal Adenopathy.    Assessment:   Annual gynecologic examination 35 y.o. Contraception: tubal ligation Obesity 1 Problem List Items Addressed This Visit     Chronic hypertension   Increased BMI    Other Visit Diagnoses    Well woman exam with routine gynecological exam    -  Primary      Plan:  Pap: Pap Co Test Mammogram: Not Indicated Stool Guaiac Testing:  Not Indicated Labs: thru pcp Routine preventative health maintenance measures emphasized: {Blank multiple:19196::"Exercise/Diet/Weight control","Tobacco Warnings","Alcohol/Substance use risks","Stress Management","Peer Pressure Issues","Safe Sex"} *** Return to Clinic - 1 [address]   Jamese Trauger [place], [place]

## 2016-07-30 ENCOUNTER — Encounter: Payer: BC Managed Care – PPO | Admitting: Obstetrics and Gynecology

## 2016-10-25 DIAGNOSIS — M9901 Segmental and somatic dysfunction of cervical region: Secondary | ICD-10-CM | POA: Diagnosis not present

## 2016-10-25 DIAGNOSIS — M9902 Segmental and somatic dysfunction of thoracic region: Secondary | ICD-10-CM | POA: Diagnosis not present

## 2016-10-25 DIAGNOSIS — M5412 Radiculopathy, cervical region: Secondary | ICD-10-CM | POA: Diagnosis not present

## 2016-10-25 DIAGNOSIS — M62830 Muscle spasm of back: Secondary | ICD-10-CM | POA: Diagnosis not present

## 2016-10-28 ENCOUNTER — Telehealth: Payer: Self-pay | Admitting: Physician Assistant

## 2016-11-24 ENCOUNTER — Ambulatory Visit: Payer: Self-pay | Admitting: Physician Assistant

## 2016-11-26 ENCOUNTER — Ambulatory Visit (INDEPENDENT_AMBULATORY_CARE_PROVIDER_SITE_OTHER): Payer: BC Managed Care – PPO | Admitting: Physician Assistant

## 2016-11-26 ENCOUNTER — Encounter: Payer: Self-pay | Admitting: Physician Assistant

## 2016-11-26 VITALS — BP 124/82 | HR 72 | Temp 98.5°F | Resp 16 | Wt 200.0 lb

## 2016-11-26 DIAGNOSIS — I10 Essential (primary) hypertension: Secondary | ICD-10-CM

## 2016-11-26 DIAGNOSIS — J3089 Other allergic rhinitis: Secondary | ICD-10-CM | POA: Diagnosis not present

## 2016-11-26 MED ORDER — LORATADINE 10 MG PO TABS: 10.0000 mg | ORAL_TABLET | Freq: Every day | ORAL | 1 refills | Status: DC

## 2016-11-26 MED ORDER — HYDROCHLOROTHIAZIDE 25 MG PO TABS: 25.0000 mg | ORAL_TABLET | Freq: Every day | ORAL | 3 refills | Status: DC

## 2016-11-26 MED ORDER — LABETALOL HCL 200 MG PO TABS: 200.0000 mg | ORAL_TABLET | Freq: Two times a day (BID) | ORAL | 3 refills | Status: DC

## 2016-11-26 NOTE — Patient Instructions (Signed)

## 2016-11-26 NOTE — Progress Notes (Signed)
Patient: Rebecca Freeman Female    DOB: 01/14/1981   36 y.o.   MRN: [id number] Visit Date: 11/26/2016  Today's Provider: Trey SailorsAdriana M Pollak, PA-C   Chief Complaint  Patient presents with  . Hypertension   Subjective:    Hypertension  This is a chronic problem. The problem is unchanged. The problem is controlled. Pertinent negatives include no anxiety, blurred vision, chest pain, headaches, malaise/fatigue, neck pain, orthopnea, palpitations, peripheral edema, PND, shortness of breath or sweats. The current treatment provides significant improvement. There are no compliance problems.    Pt doing well on HCTZ and Labetalol. Had discontinued Norvasc after pregnancy and maintained good blood pressure. Has been worked up and followed by multiple nephrologists of course of problem with no adverse findings. Doing well on current regimen.   Also having some seasonal allergies, requesting refill on Claritin.  Allergies  Allergen Reactions  . Morphine And Related Itching and Anxiety     Current Outpatient Prescriptions:  .  hydrochlorothiazide (HYDRODIURIL) 25 MG tablet, Take 1 tablet (25 mg total) by mouth daily., Disp: 90 tablet, Rfl: 3 .  labetalol (NORMODYNE) 200 MG tablet, Take 1 tablet (200 mg total) by mouth 2 (two) times daily., Disp: 180 tablet, Rfl: 3 .  loratadine (CLARITIN) 10 MG tablet, Take 1 tablet (10 mg total) by mouth daily., Disp: 90 tablet, Rfl: 1 .  Multiple Vitamin (MULTIVITAMIN) capsule, Take 1 capsule by mouth daily., Disp: , Rfl:   Review of Systems  Constitutional: Negative for malaise/fatigue.  Eyes: Negative for blurred vision.  Respiratory: Negative.  Negative for shortness of breath.   Cardiovascular: Negative.  Negative for chest pain, palpitations, orthopnea and PND.  Gastrointestinal: Negative.   Musculoskeletal: Negative.  Negative for neck pain.  Neurological: Negative for dizziness, light-headedness and headaches.    Social History  Substance  Use Topics  . Smoking status: Never Smoker  . Smokeless tobacco: Never Used  . Alcohol use 0.0 oz/week   Objective:   BP 124/82 (BP Location: Left Arm, Patient Position: Sitting, Cuff Size: Large)   Pulse 72   Temp 98.5 F (36.9 C) (Oral)   Resp 16   Wt 200 lb (90.7 kg)   LMP 10/29/2016 (Approximate)   BMI 37.79 kg/m  Vitals:   11/26/16 1606  BP: 124/82  Pulse: 72  Resp: 16  Temp: 98.5 F (36.9 C)  TempSrc: Oral  Weight: 200 lb (90.7 kg)     Physical Exam  Constitutional: She appears well-developed and well-nourished.  HENT:  Right Ear: Tympanic membrane normal.  Left Ear: Tympanic membrane normal.  Eyes: Conjunctivae are normal.  Neck: Neck supple.  Cardiovascular: Normal rate, regular rhythm, normal heart sounds and intact distal pulses.  Exam reveals no gallop and no friction rub.   No murmur heard. Pulmonary/Chest: Effort normal and breath sounds normal.  Lymphadenopathy:    She has no cervical adenopathy.  Skin: Skin is warm and dry.        Assessment & Plan:     1. Chronic hypertension  - labetalol (NORMODYNE) 200 MG tablet; Take 1 tablet (200 mg total) by mouth 2 (two) times daily.  Dispense: 180 tablet; Refill: 3 - hydrochlorothiazide (HYDRODIURIL) 25 MG tablet; Take 1 tablet (25 mg total) by mouth daily.  Dispense: 90 tablet; Refill: 3  2. Allergic rhinitis due to other allergic trigger, unspecified chronicity, unspecified seasonality  - loratadine (CLARITIN) 10 MG tablet; Take 1 tablet (10 mg total) by mouth daily.  Dispense: 90 tablet; Refill: 1  Return in about 1 year (around 11/26/2017).  The entirety of the information documented in the History of Present Illness, Review of Systems and Physical Exam were personally obtained by me. Portions of this information were initially documented by Kavin Leech, CMA and reviewed by me for thoroughness and accuracy.       Trey Sailors, PA-C  [facility]

## 2016-12-01 ENCOUNTER — Other Ambulatory Visit: Payer: Self-pay

## 2016-12-01 DIAGNOSIS — I10 Essential (primary) hypertension: Secondary | ICD-10-CM

## 2016-12-01 MED ORDER — HYDROCHLOROTHIAZIDE 25 MG PO TABS: 25.0000 mg | ORAL_TABLET | Freq: Every day | ORAL | 3 refills | Status: DC

## 2016-12-10 ENCOUNTER — Other Ambulatory Visit: Payer: Self-pay | Admitting: Physician Assistant

## 2016-12-10 DIAGNOSIS — I10 Essential (primary) hypertension: Secondary | ICD-10-CM

## 2016-12-10 MED ORDER — LABETALOL HCL 200 MG PO TABS: 200.0000 mg | ORAL_TABLET | Freq: Two times a day (BID) | ORAL | 3 refills | Status: DC

## 2016-12-10 MED ORDER — HYDROCHLOROTHIAZIDE 25 MG PO TABS: 25.0000 mg | ORAL_TABLET | Freq: Every day | ORAL | 3 refills | Status: DC

## 2016-12-10 NOTE — Telephone Encounter (Signed)
[facility] contacted office for refill request on the following medications: 1. hydrochlorothiazide (HYDRODIURIL) 25 MG tablet  2. labetalol (NORMODYNE) 200 MG tablet  The Rx that were sent 11/26/16 & 12/01/16 were sent to local pharmacy and [facility] is requesting they be sent to their mail order. Please advise. Thanks TNP

## 2016-12-16 ENCOUNTER — Ambulatory Visit (INDEPENDENT_AMBULATORY_CARE_PROVIDER_SITE_OTHER): Payer: BC Managed Care – PPO

## 2016-12-16 ENCOUNTER — Ambulatory Visit (INDEPENDENT_AMBULATORY_CARE_PROVIDER_SITE_OTHER): Payer: BC Managed Care – PPO | Admitting: Podiatry

## 2016-12-16 DIAGNOSIS — M79671 Pain in right foot: Secondary | ICD-10-CM

## 2016-12-16 DIAGNOSIS — M76821 Posterior tibial tendinitis, right leg: Secondary | ICD-10-CM

## 2016-12-16 DIAGNOSIS — R600 Localized edema: Secondary | ICD-10-CM

## 2016-12-16 DIAGNOSIS — M7751 Other enthesopathy of right foot: Secondary | ICD-10-CM | POA: Diagnosis not present

## 2016-12-17 ENCOUNTER — Encounter: Payer: BC Managed Care – PPO | Admitting: Obstetrics and Gynecology

## 2016-12-17 NOTE — Progress Notes (Signed)
   Subjective:  Patient presents today for evaluation of right Achilles pain is been going on for 1 week. Patient has a lot of swelling and tenderness. Patient has a lot of pain during walking. Patient denies trauma. Patient is currently using an over-the-counter ankle brace and taking ibuprofen and icing which all helped to ease the pain.    Objective/Physical Exam General: The patient is alert and oriented x3 in no acute distress.  Dermatology: Skin is warm, dry and supple bilateral lower extremities. Negative for open lesions or macerations.  Vascular: Palpable pedal pulses bilaterally. No edema or erythema noted. Capillary refill within normal limits.  Neurological: Epicritic and protective threshold grossly intact bilaterally.   Musculoskeletal Exam: Pain on palpation noted to the posterior tibial tendon of the right lower extremity.  Radiographic Exam:  Normal osseous mineralization. Joint spaces preserved. No fracture/dislocation/boney destruction.    Assessment: #1 patient was evaluated today. X-rays were reviewed today. #2 injection 0.5 mL Celestone Soluspan injected in the posterior tibial tendon sheath right lower extremity #3 immobilization cam boot dispensed #4 compression anklet dispensed #5 return to clinic in 2 weeks. If the patient is not better in 2 weeks we will order an MRI.   Plan of Care:  #1 Patient was evaluated. #2    Felecia Shelling, DPM [facility]  Dr. Felecia Shelling, DPM    [address]                                        [place], [place] [address]                Office [phone]  Fax [phone]

## 2016-12-18 MED ORDER — BETAMETHASONE SOD PHOS & ACET 6 (3-3) MG/ML IJ SUSP: 3.0000 mg | Freq: Once | INTRAMUSCULAR | Status: DC

## 2016-12-29 NOTE — Progress Notes (Deleted)
ANNUAL PREVENTATIVE CARE GYN  ENCOUNTER NOTE  Subjective:       Rebecca Freeman is a 36 y.o. [id number] female here for a routine annual gynecologic exam.  Current complaints: 1.      Gynecologic History No LMP recorded. Contraception: tubal ligation Last Pap: 05/2014 neg/neg/neg. Results were: normal Last mammogram: n/a. Results were:   Obstetric History OB History  Gravida Para Term Preterm AB Living  0 2 1  SAB TAB Ectopic Multiple Live Births  2 0 0 0 1    # Outcome Date GA Lbr Len/2nd Weight Sex Delivery Anes PTL Lv  4 Para 01/15/15   6 lb 9.1 oz (2.98 kg) F CS-LTranv Spinal  LIV  3 SAB           2 SAB           1 Term               Past Medical History:  Diagnosis Date  . Anemia   . Anemia   . Hematuria   . Hypertension   . Hypertension   . PONV (postoperative nausea and vomiting)     Past Surgical History:  Procedure Laterality Date  . bilateral clubfoot surgery Bilateral   . CESAREAN SECTION    . CESAREAN SECTION N/A 01/15/2015   Procedure: REPEAT CESAREAN SECTION AND BILATERAL TUBAL LIGATION ;  Surgeon: Herold Harms, MD;  Location: [facility] ORS;  Service: Obstetrics;  Laterality: N/A;    Current Outpatient Prescriptions on File Prior to Visit  Medication Sig Dispense Refill  . hydrochlorothiazide (HYDRODIURIL) 25 MG tablet Take 1 tablet (25 mg total) by mouth daily. 90 tablet 3  . labetalol (NORMODYNE) 200 MG tablet Take 1 tablet (200 mg total) by mouth 2 (two) times daily. 180 tablet 3  . loratadine (CLARITIN) 10 MG tablet Take 1 tablet (10 mg total) by mouth daily. 90 tablet 1  . Multiple Vitamin (MULTIVITAMIN) capsule Take 1 capsule by mouth daily.     Current Facility-Administered Medications on File Prior to Visit  Medication Dose Route Frequency Provider Last Rate Last Dose  . betamethasone acetate-betamethasone sodium phosphate (CELESTONE) injection 3 mg  3 mg Intramuscular Once Felecia Shelling, DPM        Allergies  Allergen Reactions  .  Morphine And Related Itching and Anxiety    Social History   Social History  . Marital status: Married    Spouse name: N/A  . Number of children: N/A  . 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 Smoking status: Never Smoker  . Smokeless tobacco: Never Used  . Alcohol use 0.0 oz/week  . Drug use: No  . Sexual activity: Not Currently    Partners: Male   Other Topics Concern  . Not on file   Social History Narrative  . No narrative on file    Family History  Problem Relation Age of Onset  . Club foot Son     BILATERAL  . Healthy Mother   . Healthy Father   . Healthy Brother     The following portions of the patient's history were reviewed and updated as appropriate: allergies, current medications, past family history, past medical history, past social history, past surgical history and problem list.  Review of Systems ROS Review of Systems - General ROS: negative for - chills, fatigue, fever, hot flashes, night sweats, weight gain or  weight loss Psychological ROS: negative for - anxiety, decreased libido, depression, mood swings, physical abuse or sexual abuse Ophthalmic ROS: negative for - blurry vision, eye pain or loss of vision ENT ROS: negative for - headaches, hearing change, visual changes or vocal changes Allergy and Immunology ROS: negative for - hives, itchy/watery eyes or seasonal allergies Hematological and Lymphatic ROS: negative for - bleeding problems, bruising, swollen lymph nodes or weight loss Endocrine ROS: negative for - galactorrhea, hair pattern changes, hot flashes, malaise/lethargy, mood swings, palpitations, polydipsia/polyuria, skin changes, temperature intolerance or unexpected weight changes Breast ROS: negative for - new or changing breast lumps or nipple discharge Respiratory ROS: negative for - cough or shortness of breath Cardiovascular ROS: negative for - chest pain, irregular  heartbeat, palpitations or shortness of breath Gastrointestinal ROS: no abdominal pain, change in bowel habits, or black or bloody stools Genito-Urinary ROS: no dysuria, trouble voiding, or hematuria Musculoskeletal ROS: negative for - joint pain or joint stiffness Neurological ROS: negative for - bowel and bladder control changes Dermatological ROS: negative for rash and skin lesion changes   Objective:   There were no vitals taken for this visit. CONSTITUTIONAL: Well-developed, well-nourished female in no acute distress.  PSYCHIATRIC: Normal mood and affect. Normal behavior. Normal judgment and thought content. NEUROLGIC: Alert and oriented to person, place, and time. Normal muscle tone coordination. No cranial nerve deficit noted. HENT:  Normocephalic, atraumatic, External right and left ear normal. Oropharynx is clear and moist EYES: Conjunctivae and EOM are normal. Pupils are equal, round, and reactive to light. No scleral icterus.  NECK: Normal range of motion, supple, no masses.  Normal thyroid.  SKIN: Skin is warm and dry. No rash noted. Not diaphoretic. No erythema. No pallor. CARDIOVASCULAR: Normal heart rate noted, regular rhythm, no murmur. RESPIRATORY: Clear to auscultation bilaterally. Effort and breath sounds normal, no problems with respiration noted. BREASTS: Symmetric in size. No masses, skin changes, nipple drainage, or lymphadenopathy. ABDOMEN: Soft, normal bowel sounds, no distention noted.  No tenderness, rebound or guarding.  BLADDER: Normal PELVIC:  External Genitalia: Normal  BUS: Normal  Vagina: Normal  Cervix: Normal  Uterus: Normal  Adnexa: Normal  RV: {Blank multiple:19196::"External Exam NormaI","No Rectal Masses","Normal Sphincter tone"}  MUSCULOSKELETAL: Normal range of motion. No tenderness.  No cyanosis, clubbing, or edema.  2+ distal pulses. LYMPHATIC: No Axillary, Supraclavicular, or Inguinal Adenopathy.    Assessment:   Annual gynecologic  examination 36 y.o. Contraception: tubal ligation Obesity 1 Problem List Items Addressed This Visit    Chronic hypertension   Increased BMI    Other Visit Diagnoses    Well woman exam with routine gynecological exam    -  Primary      Plan:  Pap: Pap Co Test Mammogram: Not Indicated Stool Guaiac Testing:  Not Indicated Labs: ? Routine preventative health maintenance measures emphasized: {Blank multiple:19196::"Exercise/Diet/Weight control","Tobacco Warnings","Alcohol/Substance use risks","Stress Management","Peer Pressure Issues","Safe Sex"} *** Return to Clinic - 1 [address] [place], [place]

## 2017-01-01 ENCOUNTER — Encounter: Payer: BC Managed Care – PPO | Admitting: Obstetrics and Gynecology

## 2017-01-02 ENCOUNTER — Ambulatory Visit: Payer: BC Managed Care – PPO | Admitting: Podiatry

## 2017-02-11 ENCOUNTER — Ambulatory Visit (INDEPENDENT_AMBULATORY_CARE_PROVIDER_SITE_OTHER): Payer: BC Managed Care – PPO | Admitting: Obstetrics and Gynecology

## 2017-02-11 ENCOUNTER — Encounter: Payer: Self-pay | Admitting: Obstetrics and Gynecology

## 2017-02-11 VITALS — BP 116/73 | HR 62 | Ht 61.0 in | Wt 199.5 lb

## 2017-02-11 DIAGNOSIS — E669 Obesity, unspecified: Secondary | ICD-10-CM | POA: Diagnosis not present

## 2017-02-11 DIAGNOSIS — I10 Essential (primary) hypertension: Secondary | ICD-10-CM | POA: Diagnosis not present

## 2017-02-11 DIAGNOSIS — Z98891 History of uterine scar from previous surgery: Secondary | ICD-10-CM

## 2017-02-11 DIAGNOSIS — Z9851 Tubal ligation status: Secondary | ICD-10-CM | POA: Diagnosis not present

## 2017-02-11 DIAGNOSIS — Z01419 Encounter for gynecological examination (general) (routine) without abnormal findings: Secondary | ICD-10-CM

## 2017-02-11 NOTE — Patient Instructions (Signed)
1. Pap smear is done 2. Self breast awareness encouraged 3. Screening labs are ordered 4. Continue with healthy eating, exercise, and control weight loss 5. Contraception-tubal ligation 6. Return in 1 year  Health Maintenance, Female Adopting a healthy lifestyle and getting preventive care can go a long way to promote health and wellness. Talk with your health care provider about what schedule of regular examinations is right for you. This is a good chance for you to check in with your provider about disease prevention and staying healthy. In between checkups, there are plenty of things you can do on your own. Experts have done a lot of research about which lifestyle changes and preventive measures are most likely to keep you healthy. Ask your health care provider for more information. Weight and diet Eat a healthy diet  Be sure to include plenty of vegetables, fruits, low-fat dairy products, and lean protein.  Do not eat a lot of foods high in solid fats, added sugars, or salt.  Get regular exercise. This is one of the most important things you can do for your health.  Most adults should exercise for at least 150 minutes each week. The exercise should increase your heart rate and make you sweat (moderate-intensity exercise).  Most adults should also do strengthening exercises at least twice a week. This is in addition to the moderate-intensity exercise. Maintain a healthy weight  Body mass index (BMI) is a measurement that can be used to identify possible weight problems. It estimates body fat based on height and weight. Your health care provider can help determine your BMI and help you achieve or maintain a healthy weight.  For females 70 years of age and older:  A BMI below 18.5 is considered underweight.  A BMI of 18.5 to 24.9 is normal.  A BMI of 25 to 29.9 is considered overweight.  A BMI of 30 and above is considered obese. Watch levels of cholesterol and blood lipids  You  should start having your blood tested for lipids and cholesterol at 35 years of age, then have this test every 5 years.  You may need to have your cholesterol levels checked more often if:  Your lipid or cholesterol levels are high.  You are older than 35 years of age.  You are at high risk for heart disease. Cancer screening Lung Cancer  Lung cancer screening is recommended for adults 27-32 years old who are at high risk for lung cancer because of a history of smoking.  A yearly low-dose CT scan of the lungs is recommended for people who:  Currently smoke.  Have quit within the past 15 years.  Have at least a 30-pack-year history of smoking. A pack year is smoking an average of one pack of cigarettes a day for 1 year.  Yearly screening should continue until it has been 15 years since you quit.  Yearly screening should stop if you develop a health problem that would prevent you from having lung cancer treatment. Breast Cancer  Practice breast self-awareness. This means understanding how your breasts normally appear and feel.  It also means doing regular breast self-exams. Let your health care provider know about any changes, no matter how small.  If you are in your 20s or 30s, you should have a clinical breast exam (CBE) by a health care provider every 1-3 years as part of a regular health exam.  If you are 36 or older, have a CBE every year. Also consider having a breast  X-ray (mammogram) every year.  If you have a family history of breast cancer, talk to your health care provider about genetic screening.  If you are at high risk for breast cancer, talk to your health care provider about having an MRI and a mammogram every year.  Breast cancer gene (BRCA) assessment is recommended for women who have family members with BRCA-related cancers. BRCA-related cancers include:  Breast.  Ovarian.  Tubal.  Peritoneal cancers.  Results of the assessment will determine the need  for genetic counseling and BRCA1 and BRCA2 testing. Cervical Cancer  Your health care provider may recommend that you be screened regularly for cancer of the pelvic organs (ovaries, uterus, and vagina). This screening involves a pelvic examination, including checking for microscopic changes to the surface of your cervix (Pap test). You may be encouraged to have this screening done every 3 years, beginning at age 24.  For women ages 26-65, health care providers may recommend pelvic exams and Pap testing every 3 years, or they may recommend the Pap and pelvic exam, combined with testing for human papilloma virus (HPV), every 5 years. Some types of HPV increase your risk of cervical cancer. Testing for HPV may also be done on women of any age with unclear Pap test results.  Other health care providers may not recommend any screening for nonpregnant women who are considered low risk for pelvic cancer and who do not have symptoms. Ask your health care provider if a screening pelvic exam is right for you.  If you have had past treatment for cervical cancer or a condition that could lead to cancer, you need Pap tests and screening for cancer for at least 20 years after your treatment. If Pap tests have been discontinued, your risk factors (such as having a new sexual partner) need to be reassessed to determine if screening should resume. Some women have medical problems that increase the chance of getting cervical cancer. In these cases, your health care provider may recommend more frequent screening and Pap tests. Colorectal Cancer  This type of cancer can be detected and often prevented.  Routine colorectal cancer screening usually begins at 35 years of age and continues through 35 years of age.  Your health care provider may recommend screening at an earlier age if you have risk factors for colon cancer.  Your health care provider may also recommend using home test kits to check for hidden blood in the  stool.  A small camera at the end of a tube can be used to examine your colon directly (sigmoidoscopy or colonoscopy). This is done to check for the earliest forms of colorectal cancer.  Routine screening usually begins at age 10.  Direct examination of the colon should be repeated every 5-10 years through 35 years of age. However, you may need to be screened more often if early forms of precancerous polyps or small growths are found. Skin Cancer  Check your skin from head to toe regularly.  Tell your health care provider about any new moles or changes in moles, especially if there is a change in a mole's shape or color.  Also tell your health care provider if you have a mole that is larger than the size of a pencil eraser.  Always use sunscreen. Apply sunscreen liberally and repeatedly throughout the day.  Protect yourself by wearing long sleeves, pants, a wide-brimmed hat, and sunglasses whenever you are outside. Heart disease, diabetes, and high blood pressure  High blood pressure causes heart  disease and increases the risk of stroke. High blood pressure is more likely to develop in:  People who have blood pressure in the high end of the normal range (130-139/85-89 mm Hg).  People who are overweight or obese.  People who are African American.  If you are 33-24 years of age, have your blood pressure checked every 3-5 years. If you are 61 years of age or older, have your blood pressure checked every year. You should have your blood pressure measured twice-once when you are at a hospital or clinic, and once when you are not at a hospital or clinic. Record the average of the two measurements. To check your blood pressure when you are not at a hospital or clinic, you can use:  An automated blood pressure machine at a pharmacy.  A home blood pressure monitor.  If you are between 66 years and 34 years old, ask your health care provider if you should take aspirin to prevent  strokes.  Have regular diabetes screenings. This involves taking a blood sample to check your fasting blood sugar level.  If you are at a normal weight and have a low risk for diabetes, have this test once every three years after 35 years of age.  If you are overweight and have a high risk for diabetes, consider being tested at a younger age or more often. Preventing infection Hepatitis B  If you have a higher risk for hepatitis B, you should be screened for this virus. You are considered at high risk for hepatitis B if:  You were born in a country where hepatitis B is common. Ask your health care provider which countries are considered high risk.  Your parents were born in a high-risk country, and you have not been immunized against hepatitis B (hepatitis B vaccine).  You have HIV or AIDS.  You use needles to inject street drugs.  You live with someone who has hepatitis B.  You have had sex with someone who has hepatitis B.  You get hemodialysis treatment.  You take certain medicines for conditions, including cancer, organ transplantation, and autoimmune conditions. Hepatitis C  Blood testing is recommended for:  Everyone born from [birth year] through 1964.  Anyone with known risk factors for hepatitis C. Sexually transmitted infections (STIs)  You should be screened for sexually transmitted infections (STIs) including gonorrhea and chlamydia if:  You are sexually active and are younger than 35 years of age.  You are older than 35 years of age and your health care provider tells you that you are at risk for this type of infection.  Your sexual activity has changed since you were last screened and you are at an increased risk for chlamydia or gonorrhea. Ask your health care provider if you are at risk.  If you do not have HIV, but are at risk, it may be recommended that you take a prescription medicine daily to prevent HIV infection. This is called pre-exposure prophylaxis  (PrEP). You are considered at risk if:  You are sexually active and do not regularly use condoms or know the HIV status of your partner(s).  You take drugs by injection.  You are sexually active with a partner who has HIV. Talk with your health care provider about whether you are at high risk of being infected with HIV. If you choose to begin PrEP, you should first be tested for HIV. You should then be tested every 3 months for as long as you are taking PrEP.  Pregnancy  If you are premenopausal and you may become pregnant, ask your health care provider about preconception counseling.  If you may become pregnant, take 400 to 800 micrograms (mcg) of folic acid every day.  If you want to prevent pregnancy, talk to your health care provider about birth control (contraception). Osteoporosis and menopause  Osteoporosis is a disease in which the bones lose minerals and strength with aging. This can result in serious bone fractures. Your risk for osteoporosis can be identified using a bone density scan.  If you are 20 years of age or older, or if you are at risk for osteoporosis and fractures, ask your health care provider if you should be screened.  Ask your health care provider whether you should take a calcium or vitamin D supplement to lower your risk for osteoporosis.  Menopause may have certain physical symptoms and risks.  Hormone replacement therapy may reduce some of these symptoms and risks. Talk to your health care provider about whether hormone replacement therapy is right for you. Follow these instructions at home:  Schedule regular health, dental, and eye exams.  Stay current with your immunizations.  Do not use any tobacco products including cigarettes, chewing tobacco, or electronic cigarettes.  If you are pregnant, do not drink alcohol.  If you are breastfeeding, limit how much and how often you drink alcohol.  Limit alcohol intake to no more than 1 drink per day for  nonpregnant women. One drink equals 12 ounces of beer, 5 ounces of wine, or 1 ounces of hard liquor.  Do not use street drugs.  Do not share needles.  Ask your health care provider for help if you need support or information about quitting drugs.  Tell your health care provider if you often feel depressed.  Tell your health care provider if you have ever been abused or do not feel safe at home. This information is not intended to replace advice given to you by your health care provider. Make sure you discuss any questions you have with your health care provider. Document Released: 03/17/2011 Document Revised: 02/07/2016 Document Reviewed: 06/05/2015 Elsevier Interactive Patient Education  2017 [employer].

## 2017-02-11 NOTE — Progress Notes (Signed)
ANNUAL PREVENTATIVE CARE GYN  ENCOUNTER NOTE  Subjective:       Rebecca Freeman is a 36 y.o. [phone] female here for a routine annual gynecologic exam.  Current complaints: 1.  none   Menstrual cycles are regular and not painful. Tubal ligation-contraception Bowel function and bladder function are normal. No major health issues have been identified. Patient's weight has remained fairly stable over the past several years.   Gynecologic History Patient's last menstrual period was 02/04/2017. Contraception: tubal ligation Last Pap: 2015 neg. Results were: normal Last mammogram: n/a. Results were: abnormal  Obstetric History OB History  Gravida Para Term Preterm AB Living  4 2 1  0 2 2  SAB TAB Ectopic Multiple Live Births  2 0 0 0 2    # Outcome Date GA Lbr Len/2nd Weight Sex Delivery Anes PTL Lv  4 Para 01/15/15   6 lb 9.1 oz (2.98 kg) F CS-LTranv Spinal  LIV  3 Term 2012   6 lb 8 oz (2.948 kg) M CS-LTranv   LIV  2 SAB           1 SAB               Past Medical History:  Diagnosis Date  . Anemia   . Anemia   . Hematuria   . Hypertension   . Hypertension   . PONV (postoperative nausea and vomiting)     Past Surgical History:  Procedure Laterality Date  . bilateral clubfoot surgery Bilateral   . CESAREAN SECTION    . CESAREAN SECTION N/A 01/15/2015   Procedure: REPEAT CESAREAN SECTION AND BILATERAL TUBAL LIGATION ;  Surgeon: Herold Harms, MD;  Location: [facility] ORS;  Service: Obstetrics;  Laterality: N/A;    Current Outpatient Prescriptions on File Prior to Visit  Medication Sig Dispense Refill  . hydrochlorothiazide (HYDRODIURIL) 25 MG tablet Take 1 tablet (25 mg total) by mouth daily. 90 tablet 3  . labetalol (NORMODYNE) 200 MG tablet Take 1 tablet (200 mg total) by mouth 2 (two) times daily. 180 tablet 3  . loratadine (CLARITIN) 10 MG tablet Take 1 tablet (10 mg total) by mouth daily. 90 tablet 1  . Multiple Vitamin (MULTIVITAMIN) capsule Take 1 capsule by  mouth daily.     No current facility-administered medications on file prior to visit.     Allergies  Allergen Reactions  . Morphine And Related Itching and Anxiety    Social History   Social History  . Marital status: Married    Spouse name: N/A  . Number of children: N/A  . 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 Smoking status: Never Smoker  . Smokeless tobacco: Never Used  . Alcohol use 0.0 oz/week  . Drug use: No  . Sexual activity: Not Currently    Partners: Male   Other Topics Concern  . Not on file   Social History Narrative  . No narrative on file    Family History  Problem Relation Age of Onset  . Healthy Mother   . Healthy Father   . Healthy Brother   . Club foot Son        BILATERAL    The following portions of the patient's history were reviewed and updated as appropriate: allergies, current medications, past family history, past medical history, past social history, past surgical history and problem list.  Review of Systems Review of Systems  Constitutional:  Negative.   HENT: Negative.   Eyes: Negative.   Respiratory: Negative.   Cardiovascular: Negative.   Gastrointestinal: Negative.   Genitourinary: Negative.   Musculoskeletal: Negative.   Skin: Negative.   Neurological: Negative.   Endo/Heme/Allergies: Negative.   Psychiatric/Behavioral: Negative.       Objective:   BP 116/73   Pulse 62   Ht 5\' 1"  (1.549 m)   Wt 199 lb 8 oz (90.5 kg)   LMP 02/04/2017   Breastfeeding? No   BMI 37.70 kg/m  CONSTITUTIONAL: Well-developed, well-nourished female in no acute distress.  PSYCHIATRIC: Normal mood and affect. Normal behavior. Normal judgment and thought content. NEUROLGIC: Alert and oriented to person, place, and time. Normal muscle tone coordination. No cranial nerve deficit noted. HENT:  Normocephalic, atraumatic, External right and left ear normal. Oropharynx is clear and  moist EYES: Conjunctivae and EOM are normal.  No scleral icterus.  NECK: Normal range of motion, supple, no masses.  Normal thyroid.  SKIN: Skin is warm and dry. No rash noted. Not diaphoretic. No erythema. No pallor. CARDIOVASCULAR: Normal heart rate noted, regular rhythm, no murmur. RESPIRATORY: Clear to auscultation bilaterally. Effort and breath sounds normal, no problems with respiration noted. BREASTS: Symmetric in size. No masses, skin changes, nipple drainage, or lymphadenopathy. ABDOMEN: Soft, normal bowel sounds, no distention noted.  No tenderness, rebound or guarding.  BLADDER: Normal PELVIC:  External Genitalia: Normal  BUS: Normal  Vagina: Normal  Cervix: Normal; no lesions; no cervical motion tenderness  Uterus: Normal; midplane, normal size and shape, mobile, nontender  Adnexa: Normal; nonpalpable and nontender  RV: External Exam NormaI  MUSCULOSKELETAL: Normal range of motion. No tenderness.  No cyanosis, clubbing, or edema.  2+ distal pulses. LYMPHATIC: No Axillary, Supraclavicular, or Inguinal Adenopathy.    Assessment:   Annual gynecologic examination 36 y.o. Contraception: tubal ligation [id number] Problem List Items Addressed This Visit    None      Plan:  Pap: Pap Co Test Mammogram: Not Indicated Stool Guaiac Testing:  Not Indicated Labs: lipid fbs a1c tsh- lab slip printed - [facility] Routine preventative health maintenance measures emphasized: Exercise/Diet/Weight control, Tobacco Warnings and Alcohol/Substance use risks Return to Clinic - 1 Year   Crystal Fifth StreetMiller, CMA  Herold HarmsMartin A Isa Kohlenberg, MD  Note: This dictation was prepared with Dragon dictation along with smaller phrase technology. Any transcriptional errors that result from this process are unintentional.

## 2017-02-13 LAB — PAP IG AND HPV HIGH-RISK
HPV, HIGH-RISK: NEGATIVE
PAP SMEAR COMMENT: 0

## 2017-02-24 DIAGNOSIS — Z01419 Encounter for gynecological examination (general) (routine) without abnormal findings: Secondary | ICD-10-CM | POA: Diagnosis not present

## 2017-02-24 DIAGNOSIS — R638 Other symptoms and signs concerning food and fluid intake: Secondary | ICD-10-CM | POA: Diagnosis not present

## 2017-02-25 LAB — LIPID PANEL
Chol/HDL Ratio: 3.4 ratio (ref 0.0–4.4)
Cholesterol, Total: 176 mg/dL (ref 100–199)
HDL: 52 mg/dL (ref >39)
LDL CALC: 104 mg/dL — AB (ref 0–99)
TRIGLYCERIDES: 102 mg/dL (ref 0–149)
VLDL Cholesterol Cal: 20 mg/dL (ref 5–40)

## 2017-02-25 LAB — HEMOGLOBIN A1C
ESTIMATED AVERAGE GLUCOSE: 100 mg/dL
Hgb A1c MFr Bld: 5.1 % (ref 4.8–5.6)

## 2017-02-25 LAB — TSH: TSH: 4.97 u[IU]/mL — ABNORMAL HIGH (ref 0.450–4.500)

## 2017-02-25 LAB — GLUCOSE, RANDOM: GLUCOSE: 92 mg/dL (ref 65–99)

## 2017-04-13 ENCOUNTER — Other Ambulatory Visit: Payer: BC Managed Care – PPO

## 2017-04-13 ENCOUNTER — Other Ambulatory Visit: Payer: Self-pay

## 2017-04-13 DIAGNOSIS — R946 Abnormal results of thyroid function studies: Secondary | ICD-10-CM | POA: Diagnosis not present

## 2017-04-13 DIAGNOSIS — R7989 Other specified abnormal findings of blood chemistry: Secondary | ICD-10-CM

## 2017-04-14 LAB — THYROID PANEL WITH TSH
Free Thyroxine Index: 2.2 (ref 1.2–4.9)
T3 Uptake Ratio: 31 % (ref 24–39)
T4, Total: 7.1 ug/dL (ref 4.5–12.0)
TSH: 1.22 u[IU]/mL (ref 0.450–4.500)

## 2017-07-21 ENCOUNTER — Other Ambulatory Visit: Payer: Self-pay | Admitting: Physician Assistant

## 2017-07-21 DIAGNOSIS — J309 Allergic rhinitis, unspecified: Secondary | ICD-10-CM

## 2017-07-21 MED ORDER — LORATADINE 10 MG PO TABS: 10.0000 mg | ORAL_TABLET | Freq: Every day | ORAL | 1 refills | Status: DC

## 2017-07-21 NOTE — Telephone Encounter (Signed)
[facility] faxed a request on the following medication. Thanks CC  loratadine (CLARITIN) 10 MG tablet

## 2017-09-25 ENCOUNTER — Ambulatory Visit (INDEPENDENT_AMBULATORY_CARE_PROVIDER_SITE_OTHER): Payer: Self-pay | Admitting: Podiatry

## 2017-09-25 ENCOUNTER — Encounter: Payer: Self-pay | Admitting: Podiatry

## 2017-09-25 DIAGNOSIS — M7661 Achilles tendinitis, right leg: Secondary | ICD-10-CM

## 2017-09-25 MED ORDER — DICLOFENAC SODIUM 75 MG PO TBEC: 75.0000 mg | DELAYED_RELEASE_TABLET | Freq: Two times a day (BID) | ORAL | 0 refills | Status: DC

## 2017-09-25 MED ORDER — METHYLPREDNISOLONE 4 MG PO TBPK: ORAL_TABLET | ORAL | 0 refills | Status: DC

## 2017-09-28 NOTE — Progress Notes (Signed)
   HPI: 37 year old female presenting today for follow-up evaluation of pain to the right Achilles that began approximately 1 month ago.  She states she was walking on the treadmill when the pain began.  Applying compression to the area and a previous injection she received help alleviate the pain.  Wearing the cam boot does not provide any relief.  Patient is here for further evaluation and treatment.    Past Medical History:  Diagnosis Date  . Anemia   . Anemia   . Hematuria   . Hypertension   . Hypertension   . PONV (postoperative nausea and vomiting)       Physical Exam: General: The patient is alert and oriented x3 in no acute distress.  Dermatology: Skin is warm, dry and supple bilateral lower extremities. Negative for open lesions or macerations.  Vascular: Palpable pedal pulses bilaterally. No edema or erythema noted. Capillary refill within normal limits.  Neurological: Epicritic and protective threshold grossly intact bilaterally.   Musculoskeletal Exam: Pain on palpation noted to the posterior tubercle of the right calcaneus at the insertion of the Achilles tendon consistent with retrocalcaneal bursitis. Range of motion within normal limits. Muscle strength 5/5 in all muscle groups bilateral lower extremities.  Assessment: 1. Insertional Achilles tendinitis right 2. Retrocalcaneal bursitis   Plan of Care:  1. Patient was evaluated.  2. Injection of 0.5 mL Celestone Soluspan injected into the retrocalcaneal bursa. Care was taken to avoid direct injection into the Achilles tendon. 3.  Prescription for Medrol Dosepak provided to patient. 4.  Prescription for meloxicam provided to patient. 5.  Cam boot did not help last time. 6.  Continue wearing good shoe gear. 7.  Return to clinic as needed.   Felecia ShellingBrent M. Mavrik Bynum, DPM [facility]  Dr. Felecia ShellingBrent M. Farrel Guimond, DPM    [address]                                        [place], [place] [address]                  Office [phone]  Fax [phone]

## 2017-12-06 ENCOUNTER — Other Ambulatory Visit: Payer: Self-pay | Admitting: Physician Assistant

## 2017-12-06 DIAGNOSIS — I10 Essential (primary) hypertension: Secondary | ICD-10-CM

## 2017-12-07 NOTE — Telephone Encounter (Signed)
Needs OV before more refills. 

## 2017-12-08 ENCOUNTER — Other Ambulatory Visit: Payer: Self-pay | Admitting: Physician Assistant

## 2017-12-08 DIAGNOSIS — I10 Essential (primary) hypertension: Secondary | ICD-10-CM

## 2017-12-08 NOTE — Telephone Encounter (Signed)
Gave one month refill until next office visit.

## 2017-12-10 NOTE — Telephone Encounter (Signed)
Apt made for 12/15/2017 at 3:40  Thanks,   -Vernona RiegerLaura

## 2017-12-15 ENCOUNTER — Ambulatory Visit (INDEPENDENT_AMBULATORY_CARE_PROVIDER_SITE_OTHER): Payer: Managed Care, Other (non HMO) | Admitting: Physician Assistant

## 2017-12-15 ENCOUNTER — Encounter: Payer: Self-pay | Admitting: Physician Assistant

## 2017-12-15 VITALS — BP 128/70 | HR 72 | Temp 98.7°F | Resp 16 | Ht 61.0 in | Wt 216.0 lb

## 2017-12-15 DIAGNOSIS — J309 Allergic rhinitis, unspecified: Secondary | ICD-10-CM | POA: Diagnosis not present

## 2017-12-15 DIAGNOSIS — R7989 Other specified abnormal findings of blood chemistry: Secondary | ICD-10-CM

## 2017-12-15 DIAGNOSIS — I10 Essential (primary) hypertension: Secondary | ICD-10-CM | POA: Diagnosis not present

## 2017-12-15 MED ORDER — LABETALOL HCL 200 MG PO TABS: 200.0000 mg | ORAL_TABLET | Freq: Two times a day (BID) | ORAL | 3 refills | Status: DC

## 2017-12-15 MED ORDER — HYDROCHLOROTHIAZIDE 25 MG PO TABS: 25.0000 mg | ORAL_TABLET | Freq: Every day | ORAL | 3 refills | Status: DC

## 2017-12-15 MED ORDER — LORATADINE 10 MG PO TABS: 10.0000 mg | ORAL_TABLET | Freq: Every day | ORAL | 3 refills | Status: DC

## 2017-12-15 NOTE — Progress Notes (Signed)
Patient: Rebecca Freeman Female    DOB: 02/17/1981   37 y.o.   MRN: [id number] Visit Date: 12/15/2017  Today's Provider: Trey SailorsAdriana M Pollak, PA-C   Chief Complaint  Patient presents with  . Hypertension   Subjective:    HPI  Hypertension, follow-up:  BP Readings from Last 3 Encounters:  12/15/17 128/70  02/11/17 116/73  11/26/16 124/82    She was last seen for hypertension 1 years ago.  BP at that visit was 124/82. Management since that visit includes no changes. She reports good compliance with treatment. She is not having side effects.  She is exercising. She is adherent to low salt diet.   Outside blood pressures are checked occasionally. She is experiencing none.  Patient denies exertional chest pressure/discomfort, lower extremity edema and palpitations.   Weight trend: stable Wt Readings from Last 3 Encounters:  12/15/17 216 lb (98 kg)  02/11/17 199 lb 8 oz (90.5 kg)  11/26/16 200 lb (90.7 kg)    Current diet: well balanced  Has a history of elevated TSH with normal T3 and T4 on follow up. Will repeat today.    Allergies  Allergen Reactions  . Morphine And Related Itching and Anxiety     Current Outpatient Medications:  .  hydrochlorothiazide (HYDRODIURIL) 25 MG tablet, Take 1 tablet (25 mg total) by mouth daily., Disp: 90 tablet, Rfl: 3 .  labetalol (NORMODYNE) 200 MG tablet, TAKE 1 TABLET BY MOUTH TWICE DAILY, Disp: 60 tablet, Rfl: 0 .  loratadine (CLARITIN) 10 MG tablet, Take 1 tablet (10 mg total) daily by mouth., Disp: 90 tablet, Rfl: 1 .  Multiple Vitamin (MULTIVITAMIN) capsule, Take 1 capsule by mouth daily., Disp: , Rfl:  .  diclofenac (VOLTAREN) 75 MG EC tablet, Take 1 tablet (75 mg total) by mouth 2 (two) times daily. (Patient not taking: Reported on 12/15/2017), Disp: 60 tablet, Rfl: 0 .  methylPREDNISolone (MEDROL DOSEPAK) 4 MG TBPK tablet, 6 day dose pack - take as directed (Patient not taking: Reported on 12/15/2017), Disp: 21 tablet, Rfl:  0  Review of Systems  Constitutional: Negative for activity change, appetite change, chills, diaphoresis, fatigue, fever and unexpected weight change.  Respiratory: Negative for cough, chest tightness, shortness of breath and wheezing.   Cardiovascular: Negative for chest pain, palpitations and leg swelling.  Musculoskeletal: Negative for arthralgias, back pain and myalgias.  Neurological: Negative for dizziness, syncope, weakness, light-headedness and headaches.  Psychiatric/Behavioral: Negative.     Social History   Tobacco Use  . Smoking status: Never Smoker  . Smokeless tobacco: Never Used  Substance Use Topics  . Alcohol use: Yes    Alcohol/week: 0.0 oz    Comment: occas   Objective:   BP 128/70 (BP Location: Right Arm, Patient Position: Sitting, Cuff Size: Large)   Pulse 72   Temp 98.7 F (37.1 C)   Resp 16   Ht 5\' 1"  (1.549 m)   Wt 216 lb (98 kg)   SpO2 96%   BMI 40.81 kg/m  Vitals:   12/15/17 1544  BP: 128/70  Pulse: 72  Resp: 16  Temp: 98.7 F (37.1 C)  SpO2: 96%  Weight: 216 lb (98 kg)  Height: 5\' 1"  (1.549 m)     Physical Exam  Constitutional: She is oriented to person, place, and time. She appears well-developed and well-nourished.  Cardiovascular: Normal rate and regular rhythm.  Pulmonary/Chest: Effort normal and breath sounds normal.  Neurological: She is alert and oriented to  person, place, and time.  Skin: Skin is warm and dry.  Psychiatric: She has a normal mood and affect. Her behavior is normal.        Assessment & Plan:     1. Essential hypertension  Continue current medications.  - Comprehensive Metabolic Panel (CMET)  2. Elevated TSH Continue to monitor.  - TSH  3. Chronic hypertension  - hydrochlorothiazide (HYDRODIURIL) 25 MG tablet; Take 1 tablet (25 mg total) by mouth daily.  Dispense: 90 tablet; Refill: 3 - labetalol (NORMODYNE) 200 MG tablet; Take 1 tablet (200 mg total) by mouth 2 (two) times daily.  Dispense: 180  tablet; Refill: 3  4. Allergic rhinitis, unspecified seasonality, unspecified trigger  - loratadine (CLARITIN) 10 MG tablet; Take 1 tablet (10 mg total) by mouth daily.  Dispense: 90 tablet; Refill: 3  Return in about 1 year (around 12/16/2018) for HTN.  The entirety of the information documented in the History of Present Illness, Review of Systems and Physical Exam were personally obtained by me. Portions of this information were initially documented by Anson [place], CMA and reviewed by me for thoroughness and accuracy.        Trey Sailors, PA-C  [facility]

## 2017-12-15 NOTE — Patient Instructions (Signed)

## 2017-12-16 LAB — COMPREHENSIVE METABOLIC PANEL
ALT: 13 IU/L (ref 0–32)
AST: 14 IU/L (ref 0–40)
Albumin/Globulin Ratio: 1.9 (ref 1.2–2.2)
Albumin: 4.2 g/dL (ref 3.5–5.5)
Alkaline Phosphatase: 49 IU/L (ref 39–117)
BUN/Creatinine Ratio: 19 (ref 9–23)
BUN: 21 mg/dL — ABNORMAL HIGH (ref 6–20)
Bilirubin Total: <0.2 mg/dL (ref 0.0–1.2)
CO2: 25 mmol/L (ref 20–29)
Calcium: 9.6 mg/dL (ref 8.7–10.2)
Chloride: 101 mmol/L (ref 96–106)
Creatinine, Ser: 1.12 mg/dL — ABNORMAL HIGH (ref 0.57–1.00)
GFR calc Af Amer: 73 mL/min/{1.73_m2} (ref >59)
GFR calc non Af Amer: 63 mL/min/{1.73_m2} (ref >59)
Globulin, Total: 2.2 g/dL (ref 1.5–4.5)
Glucose: 89 mg/dL (ref 65–99)
Potassium: 4 mmol/L (ref 3.5–5.2)
Sodium: 142 mmol/L (ref 134–144)
Total Protein: 6.4 g/dL (ref 6.0–8.5)

## 2017-12-16 LAB — TSH: TSH: 3.72 u[IU]/mL (ref 0.450–4.500)

## 2017-12-28 ENCOUNTER — Telehealth: Payer: Self-pay | Admitting: Physician Assistant

## 2017-12-28 DIAGNOSIS — R079 Chest pain, unspecified: Secondary | ICD-10-CM

## 2017-12-28 NOTE — Telephone Encounter (Signed)
Likely need cardiology referral. We can only do EKG and cholesterol, no stress testing. I will refer to cardiology if she wishes.

## 2017-12-28 NOTE — Telephone Encounter (Signed)
Pt [facility] Friday night for chest pains.  Her EKG was normal but they suggested that she have a cardio workup.  She wants to know if we can do that or does she a referral  Her call back is [phone]  Thanks teri

## 2017-12-29 NOTE — Telephone Encounter (Signed)
Referral placed.

## 2017-12-29 NOTE — Addendum Note (Signed)
Addended by: Trey SailorsPOLLAK, ADRIANA M on: 12/29/2017 10:06 AM   Modules accepted: Orders

## 2017-12-29 NOTE — Telephone Encounter (Signed)
Pt advised.  She would like to proceed with a cardiology referral.  (Anyone you recommend.)  Thanks,   -Vernona RiegerLaura

## 2018-02-05 ENCOUNTER — Ambulatory Visit (INDEPENDENT_AMBULATORY_CARE_PROVIDER_SITE_OTHER): Payer: Managed Care, Other (non HMO) | Admitting: Podiatry

## 2018-02-05 ENCOUNTER — Encounter: Payer: Self-pay | Admitting: Podiatry

## 2018-02-05 DIAGNOSIS — M6788 Other specified disorders of synovium and tendon, other site: Secondary | ICD-10-CM

## 2018-02-05 DIAGNOSIS — M7661 Achilles tendinitis, right leg: Secondary | ICD-10-CM

## 2018-02-08 NOTE — Progress Notes (Signed)
   HPI: 37 year old female presenting today for follow-up evaluation of achilles tendinosis of the right lower extremity. She states she started having a flare up about two months ago. She reports a painful nodule on the posterior right leg. She reports associated swelling of the area. Touching the nodule increases the pain. She has not done anything for treatment. Patient is here for further evaluation and treatment.    Past Medical History:  Diagnosis Date  . Anemia   . Anemia   . Hematuria   . Hypertension   . Hypertension   . PONV (postoperative nausea and vomiting)       Physical Exam: General: The patient is alert and oriented x3 in no acute distress.  Dermatology: Skin is warm, dry and supple bilateral lower extremities. Negative for open lesions or macerations.  Vascular: Palpable pedal pulses bilaterally. No edema or erythema noted. Capillary refill within normal limits.  Neurological: Epicritic and protective threshold grossly intact bilaterally.   Musculoskeletal Exam: Pain on palpation noted to the posterior tubercle of the right calcaneus at the insertion of the Achilles tendon consistent with retrocalcaneal bursitis. Range of motion within normal limits. Muscle strength 5/5 in all muscle groups bilateral lower extremities.  Assessment: 1. Insertional Achilles tendinosis right  Plan of Care:  1. Patient was evaluated.  2. Injection of 0.5 mL Celestone Soluspan injected into the retrocalcaneal bursa. Care was taken to avoid direct injection into the Achilles tendon. 3. Orders placed for an MRI.  4. Continue taking Meloxicam daily.  5. Return to clinic in 4 weeks for surgical consult.    Felecia Shelling, DPM [facility]  Dr. Felecia Shelling, DPM    [address]                                        [place], [place] [address]                Office [phone]  Fax [phone]

## 2018-02-11 ENCOUNTER — Telehealth: Payer: Self-pay

## 2018-02-11 NOTE — Telephone Encounter (Signed)
Per Darius R. Rosann Auerbach, MRI has been approved from 02/11/18 to 05/12/18 Auth # [id number]  Patient has been notified of approval and she will call and schedule her own appt

## 2018-02-11 NOTE — Telephone Encounter (Signed)
-----   Message from Felecia Shelling, DPM sent at 02/05/2018 12:22 PM EDT ----- Regarding: MRI left ankle Please order MRI w/ or w/out contrast RT ankle.   Dx: achilles tendinosis right  Thanks, Dr. Logan Bores

## 2018-02-12 ENCOUNTER — Telehealth: Payer: Self-pay | Admitting: *Deleted

## 2018-02-12 NOTE — Telephone Encounter (Signed)
"  I was able to move my MRI up to an earlier date, June 13.  I am calling to see if Dr. Logan BoresEvans will have any time available on June 27 for me to do my surgery if it's needed.  My insurance changes in July so I'd like to have everything done by the end of June if possible.  I will have a new deductible starting in July so I don't want to do one procedure in June and the other in July because I'll be paying two deductibles."  He has one opening on June 27, however, I cannot hold a space.  You have to sign consent forms then I can get you scheduled for surgery.  "I'm not asking you to schedule my surgery, it depends on the results of the MRI."  I understand, however, I can't tentatively hold the space.

## 2018-02-18 ENCOUNTER — Telehealth: Payer: Self-pay | Admitting: Podiatry

## 2018-02-18 NOTE — Telephone Encounter (Signed)
April notified via voice mail that order for MRI has been signed in chart

## 2018-02-18 NOTE — Telephone Encounter (Signed)
This is Rebecca Freeman with [facility] scheduling. Rebecca Shileyiffany is coming in for an MRI of the ankle. I need Dr. Logan BoresEvans to e-sign that order or either fax us that order. I've sent him a couple of in baskets but I haven't gotten a response nor has it been e-signed. If you have any questions give me a call back at [phone]. Thank you so much. Bye bye.

## 2018-02-22 ENCOUNTER — Telehealth: Payer: Self-pay | Admitting: Podiatry

## 2018-02-22 NOTE — Telephone Encounter (Signed)
[place]. Pt wants to know if Dr. Logan BoresEvans will do the injection before she comes in because he wanted her to have an MRI first. Pt stated her current insurance ends this month and she will be waiting to have her MRI done once she gets on her new insurance. Pt would like a call back letting her know if Dr. Logan BoresEvans will provide the injection.

## 2018-02-23 ENCOUNTER — Encounter: Payer: BC Managed Care – PPO | Admitting: Obstetrics and Gynecology

## 2018-02-24 ENCOUNTER — Encounter: Payer: Managed Care, Other (non HMO) | Admitting: Podiatry

## 2018-02-25 ENCOUNTER — Ambulatory Visit: Payer: BC Managed Care – PPO

## 2018-02-26 ENCOUNTER — Other Ambulatory Visit: Payer: Self-pay | Admitting: Family Medicine

## 2018-02-26 DIAGNOSIS — I10 Essential (primary) hypertension: Secondary | ICD-10-CM

## 2018-03-02 NOTE — Progress Notes (Signed)
ANNUAL PREVENTATIVE CARE GYN  ENCOUNTER NOTE  Subjective:       Rebecca Freeman is a 37 y.o. [phone] female here for a routine annual gynecologic exam.  Current complaints:  1.  none   Menstrual cycles are regular and not painful. Tubal ligation-contraception Bowel function and bladder function are normal.  Major health issue is Achilles tendon injury; she injured it while taking shag dancing lessons; has since had several injections and will be going to physical therapy; uncertain if surgery will be indicated in the near future.  Because of this problem she is unable to exercise to help promote weight loss.   Gynecologic History lmp- 02/22/2017 Contraception: tubal ligation Last Pap: 02/11/2017 neg/neg. Results were: normal Last mammogram: n/a. Results were: abnormal  Obstetric History OB History  Gravida Para Term Preterm AB Living  4 2 1  0 2 2  SAB TAB Ectopic Multiple Live Births  2 0 0 0 2    # Outcome Date GA Lbr Len/2nd Weight Sex Delivery Anes PTL Lv  4 Para 01/15/15   6 lb 9.1 oz (2.98 kg) F CS-LTranv Spinal  LIV  3 Term 2012   6 lb 8 oz (2.948 kg) M CS-LTranv   LIV  2 SAB           1 SAB             Past Medical History:  Diagnosis Date  . Anemia   . Anemia   . Hematuria   . Hypertension   . Hypertension   . PONV (postoperative nausea and vomiting)     Past Surgical History:  Procedure Laterality Date  . bilateral clubfoot surgery Bilateral   . CESAREAN SECTION    . CESAREAN SECTION N/A 01/15/2015   Procedure: REPEAT CESAREAN SECTION AND BILATERAL TUBAL LIGATION ;  Surgeon: Herold Harms, MD;  Location: [facility] ORS;  Service: Obstetrics;  Laterality: N/A;    Current Outpatient Medications on File Prior to Visit  Medication Sig Dispense Refill  . diclofenac (VOLTAREN) 75 MG EC tablet Take 1 tablet (75 mg total) by mouth 2 (two) times daily. (Patient not taking: Reported on 12/15/2017) 60 tablet 0  . hydrochlorothiazide (HYDRODIURIL) 25 MG tablet TAKE 1  TABLET BY MOUTH ONCE DAILY 90 tablet 1  . labetalol (NORMODYNE) 200 MG tablet Take 1 tablet (200 mg total) by mouth 2 (two) times daily. 180 tablet 3  . loratadine (CLARITIN) 10 MG tablet Take 1 tablet (10 mg total) by mouth daily. 90 tablet 3  . methylPREDNISolone (MEDROL DOSEPAK) 4 MG TBPK tablet 6 day dose pack - take as directed (Patient not taking: Reported on 12/15/2017) 21 tablet 0  . Multiple Vitamin (MULTIVITAMIN) capsule Take 1 capsule by mouth daily.     No current facility-administered medications on file prior to visit.     Allergies  Allergen Reactions  . Morphine And Related Itching and Anxiety    Social History   Socioeconomic History  . Marital status: Married    Spouse name: Not on file  . Number of children: Not on file  . Years of education: Not on file  . Highest education level: Not on file  Occupational History  . 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 Financial resource strain: Not on file  . Food insecurity:    Worry: Not on file    Inability: Not on file  . Transportation needs:    Medical: Not on file    Non-medical: Not on file  Tobacco Use  . Smoking status: Never Smoker  . Smokeless tobacco: Never Used  Substance and Sexual Activity  . Alcohol use: Yes    Alcohol/week: 0.0 oz    Comment: occas  . Drug use: No  . Sexual activity: Yes    Partners: Male    Birth control/protection: Surgical  Lifestyle  . Physical activity:    Days per week: Not on file    Minutes per session: Not on file  . Stress: Not on file  Relationships  . Social connections:    Talks on phone: Not on file    Gets together: Not on file    Attends religious service: Not on file    Active member of club or organization: Not on file    Attends meetings of clubs or organizations: Not on file    Relationship status: Not on file  . Intimate partner violence:    Fear of current or ex partner: Not on file    Emotionally abused: Not on file    Physically abused: Not  on file    Forced sexual activity: Not on file  Other Topics Concern  . Not on file  Social History Narrative  . Not on file    Family History  Problem Relation Age of Onset  . Healthy Mother   . Healthy Father   . Healthy Brother   . Club foot Son        BILATERAL  . Breast cancer Neg Hx   . Ovarian cancer Neg Hx   . Colon cancer Neg Hx   . Diabetes Neg Hx     The following portions of the patient's history were reviewed and updated as appropriate: allergies, current medications, past family history, past medical history, past social history, past surgical history and problem list.  Review of Systems Review of Systems  Constitutional: Negative.   HENT: Negative.   Eyes: Negative.   Respiratory: Negative.   Cardiovascular: Negative.   Gastrointestinal: Negative.   Genitourinary: Negative.   Musculoskeletal:       Achilles injury ongoing  Skin: Negative.   Neurological: Negative.   Endo/Heme/Allergies: Negative.   Psychiatric/Behavioral: Negative.       Objective:   BP 121/76   Pulse 79   Ht 5\' 1"  (1.549 m)   Wt 218 lb 12.8 oz (99.2 kg)   LMP 02/22/2018 (Approximate)   BMI 41.34 kg/m  CONSTITUTIONAL: Well-developed, well-nourished female in no acute distress.  PSYCHIATRIC: Normal mood and affect. Normal behavior. Normal judgment and thought content. NEUROLGIC: Alert and oriented to person, place, and time. Normal muscle tone coordination. No cranial nerve deficit noted. HENT:  Normocephalic, atraumatic, External right and left ear normal. EYES: Conjunctivae and EOM are normal.  No scleral icterus.  NECK: Normal range of motion, supple, no masses.  Normal thyroid.  SKIN: Skin is warm and dry. No rash noted. Not diaphoretic. No erythema. No pallor. CARDIOVASCULAR: Normal heart rate noted, regular rhythm, no murmur. RESPIRATORY: Clear to auscultation bilaterally. Effort and breath sounds normal, no problems with respiration noted. BREASTS: Symmetric in size. No  masses, skin changes, nipple drainage, or lymphadenopathy. ABDOMEN: Soft no distention noted.  No tenderness, rebound or guarding.  Pfannenstiel incisions are well-healed without evidence of hernia BLADDER: Normal; non-tender PELVIC: Anthropoid pelvis with prominent pubic arch  External Genitalia: Normal  BUS: Normal  Vagina: Normal estrogen effect; no discharge  Cervix: Normal; no lesions; no cervical motion tenderness  Uterus: Normal; midplane, normal size and shape, mobile,  nontender  Adnexa: Normal; nonpalpable and nontender  RV: External Exam NormaI  MUSCULOSKELETAL: Normal range of motion. No tenderness.  No cyanosis, clubbing, or edema.  2+ distal pulses. LYMPHATIC: No Axillary, Supraclavicular, or Inguinal Adenopathy.    Assessment:   Annual gynecologic examination 37 y.o. Contraception: tubal ligation [id number] Problem List Items Addressed This Visit    Hypertension   Obesity (BMI 35.0-39.9 without comorbidity)   History of C-section   S/P tubal ligation    Other Visit Diagnoses    Well woman exam with routine gynecological exam    -  Primary     Achilles tendon injury with ongoing therapy; exercise is limited Plan:  Pap: Due 2021 Mammogram: Not Indicated Stool Guaiac Testing:  Not Indicated Labs:CMP per pcp, ac1 lipid Routine preventative health maintenance measures emphasized: Exercise/Diet/Weight control, Tobacco Warnings and Alcohol/Substance use risks  Continue with podiatry follow-up for Achilles injury Contraception-tubal ligation Return to Clinic - 1 Year  [place] PointMiller, CMA  Herold HarmsMartin A Atom Solivan, MD  Note: This dictation was prepared with Dragon dictation along with smaller phrase technology. Any transcriptional errors that result from this process are unintentional.

## 2018-03-08 NOTE — Progress Notes (Signed)
This encounter was created in error - please disregard.

## 2018-03-09 ENCOUNTER — Encounter: Payer: Self-pay | Admitting: Obstetrics and Gynecology

## 2018-03-09 ENCOUNTER — Ambulatory Visit (INDEPENDENT_AMBULATORY_CARE_PROVIDER_SITE_OTHER): Payer: Managed Care, Other (non HMO) | Admitting: Obstetrics and Gynecology

## 2018-03-09 ENCOUNTER — Ambulatory Visit: Payer: Managed Care, Other (non HMO) | Admitting: Podiatry

## 2018-03-09 VITALS — BP 121/76 | HR 79 | Ht 61.0 in | Wt 218.8 lb

## 2018-03-09 DIAGNOSIS — Z9851 Tubal ligation status: Secondary | ICD-10-CM

## 2018-03-09 DIAGNOSIS — Z98891 History of uterine scar from previous surgery: Secondary | ICD-10-CM

## 2018-03-09 DIAGNOSIS — E669 Obesity, unspecified: Secondary | ICD-10-CM

## 2018-03-09 DIAGNOSIS — Z6835 Body mass index (BMI) 35.0-35.9, adult: Secondary | ICD-10-CM

## 2018-03-09 DIAGNOSIS — S86009A Unspecified injury of unspecified Achilles tendon, initial encounter: Secondary | ICD-10-CM | POA: Insufficient documentation

## 2018-03-09 DIAGNOSIS — S86009S Unspecified injury of unspecified Achilles tendon, sequela: Secondary | ICD-10-CM

## 2018-03-09 DIAGNOSIS — Z01411 Encounter for gynecological examination (general) (routine) with abnormal findings: Secondary | ICD-10-CM | POA: Diagnosis not present

## 2018-03-09 DIAGNOSIS — I10 Essential (primary) hypertension: Secondary | ICD-10-CM | POA: Diagnosis not present

## 2018-03-09 DIAGNOSIS — Z01419 Encounter for gynecological examination (general) (routine) without abnormal findings: Secondary | ICD-10-CM

## 2018-03-09 NOTE — Patient Instructions (Signed)
1.  Pap smear is not done.  Next Pap smear is due 2021 2.  Self breast awareness is encouraged 3.  Screening labs are obtained through primary care. 4.  Continue with healthy eating and exercise 5.  Contraception-tubal ligation 6.  Return in 1 year for annual exam  Health Maintenance, Female Adopting a healthy lifestyle and getting preventive care can go a long way to promote health and wellness. Talk with your health care provider about what schedule of regular examinations is right for you. This is a good chance for you to check in with your provider about disease prevention and staying healthy. In between checkups, there are plenty of things you can do on your own. Experts have done a lot of research about which lifestyle changes and preventive measures are most likely to keep you healthy. Ask your health care provider for more information. Weight and diet Eat a healthy diet  Be sure to include plenty of vegetables, fruits, low-fat dairy products, and lean protein.  Do not eat a lot of foods high in solid fats, added sugars, or salt.  Get regular exercise. This is one of the most important things you can do for your health. ? Most adults should exercise for at least 150 minutes each week. The exercise should increase your heart rate and make you sweat (moderate-intensity exercise). ? Most adults should also do strengthening exercises at least twice a week. This is in addition to the moderate-intensity exercise.  Maintain a healthy weight  Body mass index (BMI) is a measurement that can be used to identify possible weight problems. It estimates body fat based on height and weight. Your health care provider can help determine your BMI and help you achieve or maintain a healthy weight.  For females 32 years of age and older: ? A BMI below 18.5 is considered underweight. ? A BMI of 18.5 to 24.9 is normal. ? A BMI of 25 to 29.9 is considered overweight. ? A BMI of 30 and above is  considered obese.  Watch levels of cholesterol and blood lipids  You should start having your blood tested for lipids and cholesterol at 36 years of age, then have this test every 5 years.  You may need to have your cholesterol levels checked more often if: ? Your lipid or cholesterol levels are high. ? You are older than 35 years of age. ? You are at high risk for heart disease.  Cancer screening Lung Cancer  Lung cancer screening is recommended for adults 68-30 years old who are at high risk for lung cancer because of a history of smoking.  A yearly low-dose CT scan of the lungs is recommended for people who: ? Currently smoke. ? Have quit within the past 15 years. ? Have at least a 30-pack-year history of smoking. A pack year is smoking an average of one pack of cigarettes a day for 1 year.  Yearly screening should continue until it has been 15 years since you quit.  Yearly screening should stop if you develop a health problem that would prevent you from having lung cancer treatment.  Breast Cancer  Practice breast self-awareness. This means understanding how your breasts normally appear and feel.  It also means doing regular breast self-exams. Let your health care provider know about any changes, no matter how small.  If you are in your 20s or 30s, you should have a clinical breast exam (CBE) by a health care provider every 1-3 years as part  of a regular health exam.  If you are 30 or older, have a CBE every year. Also consider having a breast X-ray (mammogram) every year.  If you have a family history of breast cancer, talk to your health care provider about genetic screening.  If you are at high risk for breast cancer, talk to your health care provider about having an MRI and a mammogram every year.  Breast cancer gene (BRCA) assessment is recommended for women who have family members with BRCA-related cancers. BRCA-related cancers  include: ? Breast. ? Ovarian. ? Tubal. ? Peritoneal cancers.  Results of the assessment will determine the need for genetic counseling and BRCA1 and BRCA2 testing.  Cervical Cancer Your health care provider may recommend that you be screened regularly for cancer of the pelvic organs (ovaries, uterus, and vagina). This screening involves a pelvic examination, including checking for microscopic changes to the surface of your cervix (Pap test). You may be encouraged to have this screening done every 3 years, beginning at age 70.  For women ages 17-65, health care providers may recommend pelvic exams and Pap testing every 3 years, or they may recommend the Pap and pelvic exam, combined with testing for human papilloma virus (HPV), every 5 years. Some types of HPV increase your risk of cervical cancer. Testing for HPV may also be done on women of any age with unclear Pap test results.  Other health care providers may not recommend any screening for nonpregnant women who are considered low risk for pelvic cancer and who do not have symptoms. Ask your health care provider if a screening pelvic exam is right for you.  If you have had past treatment for cervical cancer or a condition that could lead to cancer, you need Pap tests and screening for cancer for at least 20 years after your treatment. If Pap tests have been discontinued, your risk factors (such as having a new sexual partner) need to be reassessed to determine if screening should resume. Some women have medical problems that increase the chance of getting cervical cancer. In these cases, your health care provider may recommend more frequent screening and Pap tests.  Colorectal Cancer  This type of cancer can be detected and often prevented.  Routine colorectal cancer screening usually begins at 36 years of age and continues through 36 years of age.  Your health care provider may recommend screening at an earlier age if you have risk factors  for colon cancer.  Your health care provider may also recommend using home test kits to check for hidden blood in the stool.  A small camera at the end of a tube can be used to examine your colon directly (sigmoidoscopy or colonoscopy). This is done to check for the earliest forms of colorectal cancer.  Routine screening usually begins at age 85.  Direct examination of the colon should be repeated every 5-10 years through 36 years of age. However, you may need to be screened more often if early forms of precancerous polyps or small growths are found.  Skin Cancer  Check your skin from head to toe regularly.  Tell your health care provider about any new moles or changes in moles, especially if there is a change in a mole's shape or color.  Also tell your health care provider if you have a mole that is larger than the size of a pencil eraser.  Always use sunscreen. Apply sunscreen liberally and repeatedly throughout the day.  Protect yourself by wearing long  sleeves, pants, a wide-brimmed hat, and sunglasses whenever you are outside.  Heart disease, diabetes, and high blood pressure  High blood pressure causes heart disease and increases the risk of stroke. High blood pressure is more likely to develop in: ? People who have blood pressure in the high end of the normal range (130-139/85-89 mm Hg). ? People who are overweight or obese. ? People who are African American.  If you are 93-79 years of age, have your blood pressure checked every 3-5 years. If you are 85 years of age or older, have your blood pressure checked every year. You should have your blood pressure measured twice-once when you are at a hospital or clinic, and once when you are not at a hospital or clinic. Record the average of the two measurements. To check your blood pressure when you are not at a hospital or clinic, you can use: ? An automated blood pressure machine at a pharmacy. ? A home blood pressure monitor.  If  you are between 74 years and 26 years old, ask your health care provider if you should take aspirin to prevent strokes.  Have regular diabetes screenings. This involves taking a blood sample to check your fasting blood sugar level. ? If you are at a normal weight and have a low risk for diabetes, have this test once every three years after 36 years of age. ? If you are overweight and have a high risk for diabetes, consider being tested at a younger age or more often. Preventing infection Hepatitis B  If you have a higher risk for hepatitis B, you should be screened for this virus. You are considered at high risk for hepatitis B if: ? You were born in a country where hepatitis B is common. Ask your health care provider which countries are considered high risk. ? Your parents were born in a high-risk country, and you have not been immunized against hepatitis B (hepatitis B vaccine). ? You have HIV or AIDS. ? You use needles to inject street drugs. ? You live with someone who has hepatitis B. ? You have had sex with someone who has hepatitis B. ? You get hemodialysis treatment. ? You take certain medicines for conditions, including cancer, organ transplantation, and autoimmune conditions.  Hepatitis C  Blood testing is recommended for: ? Everyone born from [birth year] through 1964. ? Anyone with known risk factors for hepatitis C.  Sexually transmitted infections (STIs)  You should be screened for sexually transmitted infections (STIs) including gonorrhea and chlamydia if: ? You are sexually active and are younger than 36 years of age. ? You are older than 36 years of age and your health care provider tells you that you are at risk for this type of infection. ? Your sexual activity has changed since you were last screened and you are at an increased risk for chlamydia or gonorrhea. Ask your health care provider if you are at risk.  If you do not have HIV, but are at risk, it may be recommended  that you take a prescription medicine daily to prevent HIV infection. This is called pre-exposure prophylaxis (PrEP). You are considered at risk if: ? You are sexually active and do not regularly use condoms or know the HIV status of your partner(s). ? You take drugs by injection. ? You are sexually active with a partner who has HIV.  Talk with your health care provider about whether you are at high risk of being infected with HIV. If  you choose to begin PrEP, you should first be tested for HIV. You should then be tested every 3 months for as long as you are taking PrEP. Pregnancy  If you are premenopausal and you may become pregnant, ask your health care provider about preconception counseling.  If you may become pregnant, take 400 to 800 micrograms (mcg) of folic acid every day.  If you want to prevent pregnancy, talk to your health care provider about birth control (contraception). Osteoporosis and menopause  Osteoporosis is a disease in which the bones lose minerals and strength with aging. This can result in serious bone fractures. Your risk for osteoporosis can be identified using a bone density scan.  If you are 51 years of age or older, or if you are at risk for osteoporosis and fractures, ask your health care provider if you should be screened.  Ask your health care provider whether you should take a calcium or vitamin D supplement to lower your risk for osteoporosis.  Menopause may have certain physical symptoms and risks.  Hormone replacement therapy may reduce some of these symptoms and risks. Talk to your health care provider about whether hormone replacement therapy is right for you. Follow these instructions at home:  Schedule regular health, dental, and eye exams.  Stay current with your immunizations.  Do not use any tobacco products including cigarettes, chewing tobacco, or electronic cigarettes.  If you are pregnant, do not drink alcohol.  If you are  breastfeeding, limit how much and how often you drink alcohol.  Limit alcohol intake to no more than 1 drink per day for nonpregnant women. One drink equals 12 ounces of beer, 5 ounces of wine, or 1 ounces of hard liquor.  Do not use street drugs.  Do not share needles.  Ask your health care provider for help if you need support or information about quitting drugs.  Tell your health care provider if you often feel depressed.  Tell your health care provider if you have ever been abused or do not feel safe at home. This information is not intended to replace advice given to you by your health care provider. Make sure you discuss any questions you have with your health care provider. Document Released: 03/17/2011 Document Revised: 02/07/2016 Document Reviewed: 06/05/2015 Elsevier Interactive Patient Education  [employer].

## 2018-03-12 ENCOUNTER — Other Ambulatory Visit: Payer: Managed Care, Other (non HMO)

## 2018-03-13 LAB — COMPREHENSIVE METABOLIC PANEL
ALT: 38 IU/L — AB (ref 0–32)
AST: 22 IU/L (ref 0–40)
Albumin/Globulin Ratio: 2.6 — ABNORMAL HIGH (ref 1.2–2.2)
Albumin: 4.2 g/dL (ref 3.5–5.5)
Alkaline Phosphatase: 46 IU/L (ref 39–117)
BILIRUBIN TOTAL: 0.3 mg/dL (ref 0.0–1.2)
BUN/Creatinine Ratio: 17 (ref 9–23)
BUN: 18 mg/dL (ref 6–20)
CO2: 26 mmol/L (ref 20–29)
CREATININE: 1.09 mg/dL — AB (ref 0.57–1.00)
Calcium: 9.2 mg/dL (ref 8.7–10.2)
Chloride: 102 mmol/L (ref 96–106)
GFR calc Af Amer: 75 mL/min/{1.73_m2} (ref >59)
GFR, EST NON AFRICAN AMERICAN: 65 mL/min/{1.73_m2} (ref >59)
GLUCOSE: 87 mg/dL (ref 65–99)
Globulin, Total: 1.6 g/dL (ref 1.5–4.5)
Potassium: 4.1 mmol/L (ref 3.5–5.2)
Sodium: 141 mmol/L (ref 134–144)
TOTAL PROTEIN: 5.8 g/dL — AB (ref 6.0–8.5)

## 2018-03-13 LAB — LIPID PANEL
Chol/HDL Ratio: 4 ratio (ref 0.0–4.4)
Cholesterol, Total: 193 mg/dL (ref 100–199)
HDL: 48 mg/dL (ref >39)
LDL Calculated: 109 mg/dL — ABNORMAL HIGH (ref 0–99)
Triglycerides: 182 mg/dL — ABNORMAL HIGH (ref 0–149)
VLDL CHOLESTEROL CAL: 36 mg/dL (ref 5–40)

## 2018-03-13 LAB — HEMOGLOBIN A1C
Est. average glucose Bld gHb Est-mCnc: 103 mg/dL
HEMOGLOBIN A1C: 5.2 % (ref 4.8–5.6)

## 2018-03-15 ENCOUNTER — Other Ambulatory Visit: Payer: Self-pay

## 2018-03-15 MED ORDER — DICLOFENAC SODIUM 75 MG PO TBEC: 75.0000 mg | DELAYED_RELEASE_TABLET | Freq: Two times a day (BID) | ORAL | 0 refills | Status: DC

## 2018-03-15 NOTE — Telephone Encounter (Signed)
Pharmacy refill request for diclofenac    Per Dr. Logan BoresEvans, ok to refill.   Script has been sent to pharmacy

## 2018-03-17 ENCOUNTER — Other Ambulatory Visit: Payer: Self-pay

## 2018-03-17 DIAGNOSIS — R748 Abnormal levels of other serum enzymes: Secondary | ICD-10-CM

## 2018-03-22 ENCOUNTER — Ambulatory Visit: Payer: Managed Care, Other (non HMO)

## 2018-03-25 ENCOUNTER — Telehealth: Payer: Self-pay | Admitting: *Deleted

## 2018-03-25 NOTE — Telephone Encounter (Signed)
I cancelled the orders for MRI at [facility].

## 2018-03-25 NOTE — Telephone Encounter (Signed)
Pt states she has new insurance and was to have a MRI.

## 2018-03-25 NOTE — Telephone Encounter (Signed)
I informed pt of the recommendations of the [facility] - Insurance/Accounts Receivable. Pt states she does not have the new insurance card. I told pt to take the card to the [place] office and have the receptionist make a copy and put in her records and I would send a note to them and A. Glynda JaegerVenable, LPN with instructions to copy card to records, reorder the MRI under the new insurance and cancel the original MRI.

## 2018-03-25 NOTE — Telephone Encounter (Signed)
Pt states she did not want the MRI ordered until she had her new insurance. I told pt I would check with our Insurance/Accounts Receivable agent and call back. [facility]/Accounts Receivable agent states cancel the initial MRI and reorder under the new insurance.

## 2018-04-14 ENCOUNTER — Telehealth: Payer: Self-pay | Admitting: Podiatry

## 2018-04-14 NOTE — Telephone Encounter (Signed)
I entered new insurance information of patient MRI. She now has [employer] insurance.

## 2018-05-21 ENCOUNTER — Telehealth: Payer: Self-pay

## 2018-05-21 ENCOUNTER — Other Ambulatory Visit: Payer: Self-pay

## 2018-05-21 DIAGNOSIS — M7661 Achilles tendinitis, right leg: Secondary | ICD-10-CM

## 2018-05-21 NOTE — Telephone Encounter (Signed)
Per Tresa Endo with [employer], MRI approved from 05/21/18 to 06/19/18   Auth # [id number]  Patient notified via voice mail to call and schedule own appt.

## 2018-06-03 ENCOUNTER — Telehealth: Payer: Self-pay

## 2018-06-03 NOTE — Telephone Encounter (Signed)
Patient was called again and voice message left informing her that MRI authorization expires on 06/19/18 and to call and schedule her appt at her convenience.

## 2018-06-16 ENCOUNTER — Telehealth: Payer: Self-pay | Admitting: Podiatry

## 2018-06-16 NOTE — Telephone Encounter (Signed)
MRI orders cancelled at pt's request.

## 2018-06-16 NOTE — Telephone Encounter (Signed)
I'm a current pt of Dr. Logan Bores and I have an appointment to see another doctor. I need my medical records transferred to Dr. Remer Macho with [facility]. Their office number is [phone]. Thank you. Bye bye.

## 2018-06-16 NOTE — Telephone Encounter (Signed)
Patient would like for you to cancel the MRI request. She is going to see another Physician and would like to have her records transferred. (Call transferred to [facility]).

## 2018-06-16 NOTE — Telephone Encounter (Signed)
I called the pt back in regards to the message she left requesting her records be forwarded to Dr. Remer Macho. Pt stated she would also like a disc of her x-rays be mailed to him as well. I told the pt she would need to fill out and sign a medical records release form authorizing [place] to release her records to Dr. Remer Macho. Pt requested the form be e-mailed to her at the e-mail address of [email or web address]. I told the pt once I received it back, we would release her records to Dr. Remer Macho.

## 2018-06-28 DIAGNOSIS — M79671 Pain in right foot: Secondary | ICD-10-CM | POA: Diagnosis not present

## 2018-06-28 DIAGNOSIS — M7661 Achilles tendinitis, right leg: Secondary | ICD-10-CM | POA: Diagnosis not present

## 2018-06-28 DIAGNOSIS — Q666 Other congenital valgus deformities of feet: Secondary | ICD-10-CM | POA: Diagnosis not present

## 2018-06-28 DIAGNOSIS — M25571 Pain in right ankle and joints of right foot: Secondary | ICD-10-CM | POA: Diagnosis not present

## 2018-07-08 DIAGNOSIS — M25571 Pain in right ankle and joints of right foot: Secondary | ICD-10-CM | POA: Diagnosis not present

## 2018-07-08 DIAGNOSIS — M79671 Pain in right foot: Secondary | ICD-10-CM | POA: Diagnosis not present

## 2018-07-08 DIAGNOSIS — M7661 Achilles tendinitis, right leg: Secondary | ICD-10-CM | POA: Diagnosis not present

## 2018-07-15 DIAGNOSIS — M7661 Achilles tendinitis, right leg: Secondary | ICD-10-CM | POA: Diagnosis not present

## 2018-08-03 DIAGNOSIS — M7661 Achilles tendinitis, right leg: Secondary | ICD-10-CM | POA: Diagnosis not present

## 2018-08-03 DIAGNOSIS — M25571 Pain in right ankle and joints of right foot: Secondary | ICD-10-CM | POA: Diagnosis not present

## 2018-08-05 DIAGNOSIS — M25571 Pain in right ankle and joints of right foot: Secondary | ICD-10-CM | POA: Diagnosis not present

## 2018-08-05 DIAGNOSIS — M7661 Achilles tendinitis, right leg: Secondary | ICD-10-CM | POA: Diagnosis not present

## 2018-08-10 DIAGNOSIS — M25571 Pain in right ankle and joints of right foot: Secondary | ICD-10-CM | POA: Diagnosis not present

## 2018-08-10 DIAGNOSIS — M7661 Achilles tendinitis, right leg: Secondary | ICD-10-CM | POA: Diagnosis not present

## 2018-08-19 DIAGNOSIS — M25571 Pain in right ankle and joints of right foot: Secondary | ICD-10-CM | POA: Diagnosis not present

## 2018-08-19 DIAGNOSIS — M7661 Achilles tendinitis, right leg: Secondary | ICD-10-CM | POA: Diagnosis not present

## 2018-08-24 DIAGNOSIS — M25571 Pain in right ankle and joints of right foot: Secondary | ICD-10-CM | POA: Diagnosis not present

## 2018-08-24 DIAGNOSIS — M7661 Achilles tendinitis, right leg: Secondary | ICD-10-CM | POA: Diagnosis not present

## 2018-08-31 DIAGNOSIS — M7661 Achilles tendinitis, right leg: Secondary | ICD-10-CM | POA: Diagnosis not present

## 2018-08-31 DIAGNOSIS — M25571 Pain in right ankle and joints of right foot: Secondary | ICD-10-CM | POA: Diagnosis not present

## 2018-09-02 DIAGNOSIS — M25571 Pain in right ankle and joints of right foot: Secondary | ICD-10-CM | POA: Diagnosis not present

## 2018-09-02 DIAGNOSIS — M7661 Achilles tendinitis, right leg: Secondary | ICD-10-CM | POA: Diagnosis not present

## 2018-12-27 ENCOUNTER — Other Ambulatory Visit: Payer: Self-pay | Admitting: Physician Assistant

## 2018-12-27 DIAGNOSIS — I10 Essential (primary) hypertension: Secondary | ICD-10-CM

## 2018-12-27 NOTE — Telephone Encounter (Signed)
Please Review

## 2018-12-28 NOTE — Telephone Encounter (Signed)
Can we please schedule yearly visit for 2 months out?

## 2018-12-29 NOTE — Telephone Encounter (Signed)
Spoke to pt and scheduled her CPE appt for June.  dbs

## 2019-03-02 ENCOUNTER — Encounter: Payer: Self-pay | Admitting: Physician Assistant

## 2019-03-02 ENCOUNTER — Other Ambulatory Visit: Payer: Self-pay

## 2019-03-02 ENCOUNTER — Ambulatory Visit (INDEPENDENT_AMBULATORY_CARE_PROVIDER_SITE_OTHER): Payer: BC Managed Care – PPO | Admitting: Physician Assistant

## 2019-03-02 VITALS — BP 116/71 | HR 67 | Temp 98.2°F | Resp 16 | Ht 61.0 in | Wt 202.6 lb

## 2019-03-02 DIAGNOSIS — E7800 Pure hypercholesterolemia, unspecified: Secondary | ICD-10-CM

## 2019-03-02 DIAGNOSIS — I10 Essential (primary) hypertension: Secondary | ICD-10-CM

## 2019-03-02 DIAGNOSIS — E669 Obesity, unspecified: Secondary | ICD-10-CM | POA: Diagnosis not present

## 2019-03-02 DIAGNOSIS — Z0000 Encounter for general adult medical examination without abnormal findings: Secondary | ICD-10-CM | POA: Diagnosis not present

## 2019-03-02 DIAGNOSIS — I159 Secondary hypertension, unspecified: Secondary | ICD-10-CM | POA: Diagnosis not present

## 2019-03-02 DIAGNOSIS — E039 Hypothyroidism, unspecified: Secondary | ICD-10-CM | POA: Diagnosis not present

## 2019-03-02 DIAGNOSIS — J309 Allergic rhinitis, unspecified: Secondary | ICD-10-CM

## 2019-03-02 DIAGNOSIS — E038 Other specified hypothyroidism: Secondary | ICD-10-CM

## 2019-03-02 MED ORDER — LABETALOL HCL 200 MG PO TABS: 200.0000 mg | ORAL_TABLET | Freq: Two times a day (BID) | ORAL | 3 refills | Status: DC

## 2019-03-02 MED ORDER — LORATADINE 10 MG PO TABS: 10.0000 mg | ORAL_TABLET | Freq: Every day | ORAL | 3 refills | Status: DC

## 2019-03-02 MED ORDER — HYDROCHLOROTHIAZIDE 25 MG PO TABS: 25.0000 mg | ORAL_TABLET | Freq: Every day | ORAL | 3 refills | Status: DC

## 2019-03-02 NOTE — Patient Instructions (Signed)

## 2019-03-02 NOTE — Progress Notes (Signed)
Patient: Rebecca Freeman, Female    DOB: 1980-10-11, 38 y.o.   MRN: [id number] Visit Date: 03/02/2019  Today's Provider: Trinna Post, PA-C   Chief Complaint  Patient presents with  . Annual Exam   Subjective:     Annual physical exam Rebecca Freeman is a 38 y.o. female who presents today for health maintenance and complete physical. She feels well. She reports exercising none. She reports she is sleeping well. She has lost 16 lbs since last visit. Reports she got down to 194 lbs but has gained some weight since COVID-19 started. 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 Her 37 year old and 37 year old have been home with her.   Obesity: 16 lb weight loss since last year.   Wt Readings from Last 3 Encounters:  03/02/19 202 lb 9.6 oz (91.9 kg)  03/09/18 218 lb 12.8 oz (99.2 kg)  12/15/17 216 lb (98 kg)   HTN: Currently taking HCTZ 25 mg daily and labetalol 200 mg BID without issue.   BP Readings from Last 3 Encounters:  03/02/19 116/71  03/09/18 121/76  12/15/17 128/70   PAP: 02/11/2017 normal and negative HPV  Family history negative breast cancer, grandfather with colon cancer at age 45. -----------------------------------------------------------------   Review of Systems  Constitutional: Negative.   HENT: Negative.   Eyes: Negative.   Respiratory: Negative.   Cardiovascular: Negative.   Gastrointestinal: Negative.   Endocrine: Negative.   Genitourinary: Negative.   Musculoskeletal: Negative.   Skin: Negative.   Allergic/Immunologic: Negative.   Hematological: Negative.   Psychiatric/Behavioral: Negative.     Social History      She  reports that she has never smoked. She has never used smokeless tobacco. She reports current alcohol use. She reports that she does not use drugs.       Social History   Socioeconomic History  . Marital status: Married    Spouse name: Not on file  . Number of  children: Not on file  . Years of education: Not on file  . Highest education level: Not on file  Occupational History  . 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 Financial resource strain: Not on file  . Food insecurity    Worry: Not on file    Inability: Not on file  . Transportation needs    Medical: Not on file    Non-medical: Not on file  Tobacco Use  . Smoking status: Never Smoker  . Smokeless tobacco: Never Used  Substance and Sexual Activity  . Alcohol use: Yes    Alcohol/week: 0.0 standard drinks    Comment: occas  . Drug use: No  . Sexual activity: Yes    Partners: Male    Birth control/protection: Surgical  Lifestyle  . Physical activity    Days per week: 0 days    Minutes per session: 0 min  . Stress: Not on file  Relationships  . Social Herbalist on phone: Not on file    Gets together: Not on file    Attends religious service: Not on file    Active member of club or organization: Not on file    Attends meetings of clubs or organizations: Not on file    Relationship status: Not on file  Other Topics Concern  . Not on file  Social History Narrative  . Not on file    Past  Medical History:  Diagnosis Date  . Anemia   . Anemia   . Hematuria   . Hypertension   . Hypertension   . PONV (postoperative nausea and vomiting)      Patient Active Problem List   Diagnosis Date Noted  . Injury of Achilles tendon 03/09/2018  . History of C-section 02/11/2017  . S/P tubal ligation 02/11/2017  . Subclinical hypothyroidism 11/08/2015  . Avitaminosis D 11/08/2015  . Allergic rhinitis 09/26/2015  . Hypertension 03/01/2015  . Obesity (BMI 35.0-39.9 without comorbidity) 03/01/2015  . Chronic infection of sinus 10/24/2009    Past Surgical History:  Procedure Laterality Date  . bilateral clubfoot surgery Bilateral   . CESAREAN SECTION    . CESAREAN SECTION N/A 01/15/2015   Procedure: REPEAT CESAREAN SECTION AND BILATERAL TUBAL LIGATION ;   Surgeon: Herold HarmsMartin A Defrancesco, MD;  Location: [facility] ORS;  Service: Obstetrics;  Laterality: N/A;  . TUBAL LIGATION      Family History        Family Status  Relation Name Status  . Mother  Alive  . Father  Alive  . Brother  Alive  . Son  (Not Specified)  . Neg Hx  (Not Specified)        Her family history includes Club foot in her son; Healthy in her brother, father, and mother. There is no history of Breast cancer, Ovarian cancer, Colon cancer, or Diabetes.      Allergies  Allergen Reactions  . Morphine And Related Itching and Anxiety     Current Outpatient Medications:  .  hydrochlorothiazide (HYDRODIURIL) 25 MG tablet, TAKE 1 TABLET BY MOUTH ONCE DAILY, Disp: 90 tablet, Rfl: 0 .  labetalol (NORMODYNE) 200 MG tablet, TAKE 1 TABLET BY MOUTH TWICE DAILY, Disp: 180 tablet, Rfl: 0 .  loratadine (CLARITIN) 10 MG tablet, Take 1 tablet (10 mg total) by mouth daily., Disp: 90 tablet, Rfl: 3 .  Multiple Vitamin (MULTIVITAMIN) capsule, Take 1 capsule by mouth daily., Disp: , Rfl:    Patient Care Team: Maryella ShiversPollak, Adriana M, PA-C as PCP - General (Physician Assistant)    Objective:    Vitals: BP 116/71 (BP Location: Left Arm, Patient Position: Sitting, Cuff Size: Normal)   Pulse 67   Temp 98.2 F (36.8 C)   Resp 16   Ht 5\' 1"  (1.549 m)   Wt 202 lb 9.6 oz (91.9 kg)   LMP 02/21/2019 (Exact Date)   BMI 38.28 kg/m    Vitals:   03/02/19 1008  BP: 116/71  Pulse: 67  Resp: 16  Temp: 98.2 F (36.8 C)  Weight: 202 lb 9.6 oz (91.9 kg)  Height: 5\' 1"  (1.549 m)     Physical Exam Constitutional:      Appearance: Normal appearance.  Cardiovascular:     Rate and Rhythm: Normal rate and regular rhythm.     Heart sounds: Normal heart sounds.  Pulmonary:     Breath sounds: Normal breath sounds.  Abdominal:     General: Abdomen is flat. Bowel sounds are normal.     Palpations: Abdomen is soft.     Tenderness: There is no abdominal tenderness.  Skin:    General: Skin is warm and  dry.  Neurological:     Mental Status: She is alert and oriented to person, place, and time. Mental status is at baseline.  Psychiatric:        Mood and Affect: Mood normal.        Behavior: Behavior normal.  Depression Screen PHQ 2/9 Scores 03/02/2019 12/15/2017 11/26/2016  PHQ - 2 Score 0 0 0  PHQ- 9 Score 0 - 0       Assessment & Plan:     Routine Health Maintenance and Physical Exam  Exercise Activities and Dietary recommendations Goals   None     Immunization History  Administered Date(s) Administered  . Pneumococcal Polysaccharide-23 07/11/2013  . Tdap 11/02/2014    Health Maintenance  Topic Date Due  . INFLUENZA VACCINE  04/16/2019  . PAP SMEAR-Modifier  02/12/2020  . TETANUS/TDAP  11/02/2024  . HIV Screening  Completed     Discussed health benefits of physical activity, and encouraged her to engage in regular exercise appropriate for her age and condition.    1. Annual physical exam   2. Secondary hypertension  - CBC with Differential - Comprehensive Metabolic Panel (CMET)  3. Subclinical hypothyroidism  - TSH  4. Obesity (BMI 35.0-39.9 without comorbidity)  - HgB A1c  5. Hypercholesterolemia  - Lipid Profile  6. Chronic hypertension  - hydrochlorothiazide (HYDRODIURIL) 25 MG tablet; Take 1 tablet (25 mg total) by mouth daily.  Dispense: 90 tablet; Refill: 3 - labetalol (NORMODYNE) 200 MG tablet; Take 1 tablet (200 mg total) by mouth 2 (two) times daily.  Dispense: 180 tablet; Refill: 3  7. Allergic rhinitis, unspecified seasonality, unspecified trigger  - loratadine (CLARITIN) 10 MG tablet; Take 1 tablet (10 mg total) by mouth daily.  Dispense: 90 tablet; Refill: 3  The entirety of the information documented in the History of Present Illness, Review of Systems and Physical Exam were personally obtained by me. Portions of this information were initially documented by Rondel BatonSulibeya Dimas, CMA and reviewed by me for thoroughness and  accuracy.   F/u 1 year for CPE and HTN  --------------------------------------------------------------------    Trey SailorsAdriana M Pollak, PA-C  [facility]

## 2019-03-03 ENCOUNTER — Telehealth: Payer: Self-pay

## 2019-03-03 LAB — COMPREHENSIVE METABOLIC PANEL
ALT: 17 IU/L (ref 0–32)
AST: 16 IU/L (ref 0–40)
Albumin/Globulin Ratio: 2.3 — ABNORMAL HIGH (ref 1.2–2.2)
Albumin: 4.4 g/dL (ref 3.8–4.8)
Alkaline Phosphatase: 45 IU/L (ref 39–117)
BUN/Creatinine Ratio: 13 (ref 9–23)
BUN: 14 mg/dL (ref 6–20)
Bilirubin Total: 0.3 mg/dL (ref 0.0–1.2)
CO2: 27 mmol/L (ref 20–29)
Calcium: 9.3 mg/dL (ref 8.7–10.2)
Chloride: 102 mmol/L (ref 96–106)
Creatinine, Ser: 1.12 mg/dL — ABNORMAL HIGH (ref 0.57–1.00)
GFR calc Af Amer: 73 mL/min/{1.73_m2} (ref >59)
GFR calc non Af Amer: 63 mL/min/{1.73_m2} (ref >59)
Globulin, Total: 1.9 g/dL (ref 1.5–4.5)
Glucose: 86 mg/dL (ref 65–99)
Potassium: 4 mmol/L (ref 3.5–5.2)
Sodium: 140 mmol/L (ref 134–144)
Total Protein: 6.3 g/dL (ref 6.0–8.5)

## 2019-03-03 LAB — CBC WITH DIFFERENTIAL/PLATELET
Basophils Absolute: 0 10*3/uL (ref 0.0–0.2)
Basos: 1 %
EOS (ABSOLUTE): 0.2 10*3/uL (ref 0.0–0.4)
Eos: 4 %
Hematocrit: 42.3 % (ref 34.0–46.6)
Hemoglobin: 14.6 g/dL (ref 11.1–15.9)
Immature Grans (Abs): 0 10*3/uL (ref 0.0–0.1)
Immature Granulocytes: 0 %
Lymphocytes Absolute: 1.6 10*3/uL (ref 0.7–3.1)
Lymphs: 29 %
MCH: 31.6 pg (ref 26.6–33.0)
MCHC: 34.5 g/dL (ref 31.5–35.7)
MCV: 92 fL (ref 79–97)
Monocytes Absolute: 0.4 10*3/uL (ref 0.1–0.9)
Monocytes: 8 %
Neutrophils Absolute: 3.2 10*3/uL (ref 1.4–7.0)
Neutrophils: 58 %
Platelets: 224 10*3/uL (ref 150–450)
RBC: 4.62 x10E6/uL (ref 3.77–5.28)
RDW: 12.1 % (ref 11.7–15.4)
WBC: 5.6 10*3/uL (ref 3.4–10.8)

## 2019-03-03 LAB — LIPID PANEL
Chol/HDL Ratio: 3.5 ratio (ref 0.0–4.4)
Cholesterol, Total: 178 mg/dL (ref 100–199)
HDL: 51 mg/dL (ref >39)
LDL Calculated: 100 mg/dL — ABNORMAL HIGH (ref 0–99)
Triglycerides: 134 mg/dL (ref 0–149)
VLDL Cholesterol Cal: 27 mg/dL (ref 5–40)

## 2019-03-03 LAB — TSH: TSH: 2.33 u[IU]/mL (ref 0.450–4.500)

## 2019-03-03 LAB — HEMOGLOBIN A1C
Est. average glucose Bld gHb Est-mCnc: 103 mg/dL
Hgb A1c MFr Bld: 5.2 % (ref 4.8–5.6)

## 2019-03-03 NOTE — Telephone Encounter (Signed)
Patient advised as below.  

## 2019-03-03 NOTE — Telephone Encounter (Signed)
-----   Message from Trinna Post, [place] sent at 03/03/2019 12:27 PM EDT ----- Cholesterol normal. TSH normal. CBC normal. Kidney function slightly reduced but stable from last year. If she is taking anything like ibuprofen, advil, aleve, naproxen she should top and use tylenol instead for pain if needed. A1c normal.

## 2019-03-15 ENCOUNTER — Encounter: Payer: Managed Care, Other (non HMO) | Admitting: Obstetrics and Gynecology

## 2019-08-31 DIAGNOSIS — Z20828 Contact with and (suspected) exposure to other viral communicable diseases: Secondary | ICD-10-CM | POA: Diagnosis not present

## 2020-01-11 ENCOUNTER — Other Ambulatory Visit: Payer: Self-pay | Admitting: Physician Assistant

## 2020-01-11 DIAGNOSIS — J309 Allergic rhinitis, unspecified: Secondary | ICD-10-CM

## 2020-01-23 DIAGNOSIS — Z03818 Encounter for observation for suspected exposure to other biological agents ruled out: Secondary | ICD-10-CM | POA: Diagnosis not present

## 2020-02-08 ENCOUNTER — Other Ambulatory Visit: Payer: Self-pay | Admitting: Physician Assistant

## 2020-02-08 DIAGNOSIS — I10 Essential (primary) hypertension: Secondary | ICD-10-CM

## 2020-05-22 ENCOUNTER — Other Ambulatory Visit: Payer: Self-pay | Admitting: Physician Assistant

## 2020-05-22 DIAGNOSIS — I10 Essential (primary) hypertension: Secondary | ICD-10-CM

## 2020-05-22 NOTE — Telephone Encounter (Signed)
Requested medication (s) are due for refill today: yes  Requested medication (s) are on the active medication list: yes   Last refill: 02/09/2020  Future visit scheduled: no  Notes to clinic:  overdue for office visit  Vm left for patient to contact office to schedule appointment    Requested Prescriptions  Pending Prescriptions Disp Refills   hydrochlorothiazide (HYDRODIURIL) 25 MG tablet [Pharmacy Med Name: HYDROCHLOROTHIAZIDE 25 MG TAB] 30 tablet 0    Sig: TAKE 1 TABLET BY MOUTH ONCE DAILY      Cardiovascular: Diuretics - Thiazide Failed - 05/22/2020 11:47 AM      Failed - Ca in normal range and within 360 days    Calcium  Date Value Ref Range Status  03/02/2019 9.3 8.7 - 10.2 mg/dL Final          Failed - Cr in normal range and within 360 days    Creatinine, Ser  Date Value Ref Range Status  03/02/2019 1.12 (H) 0.57 - 1.00 mg/dL Final          Failed - K in normal range and within 360 days    Potassium  Date Value Ref Range Status  03/02/2019 4.0 3.5 - 5.2 mmol/L Final          Failed - Na in normal range and within 360 days    Sodium  Date Value Ref Range Status  03/02/2019 140 134 - 144 mmol/L Final          Failed - Valid encounter within last 6 months    Recent Outpatient Visits           1 year ago Annual physical exam   [facility] Trey Sailors, [place]   2 years ago Essential hypertension   [facility] Osvaldo Angst M, [place]   3 years ago Chronic hypertension   [facility] Osvaldo Angst M, [place]   4 years ago Chronic hypertension   [facility] Lorie Phenix, [place]              Passed - Last BP in normal range    BP Readings from Last 1 Encounters:  03/02/19 116/71            labetalol (NORMODYNE) 200 MG tablet [Pharmacy Med Name: LABETALOL HCL 200 MG TAB] 30 tablet 0    Sig: TAKE 1 TABLET BY MOUTH TWICE DAILY      Cardiovascular:  Beta Blockers Failed - 05/22/2020 11:47 AM       Failed - Valid encounter within last 6 months    Recent Outpatient Visits           1 year ago Annual physical exam   [facility] Trey Sailors, [place]   2 years ago Essential hypertension   [facility] Osvaldo Angst M, [place]   3 years ago Chronic hypertension   [facility] Osvaldo Angst M, [place]   4 years ago Chronic hypertension   [facility] Lorie Phenix, [place]              Passed - Last BP in normal range    BP Readings from Last 1 Encounters:  03/02/19 116/71          Passed - Last Heart Rate in normal range    Pulse Readings from Last 1 Encounters:  03/02/19 67

## 2020-06-12 ENCOUNTER — Other Ambulatory Visit: Payer: Self-pay | Admitting: Physician Assistant

## 2020-06-12 DIAGNOSIS — I10 Essential (primary) hypertension: Secondary | ICD-10-CM

## 2020-06-12 NOTE — Telephone Encounter (Signed)
Requested medication (s) are due for refill today- yes  Requested medication (s) are on the active medication list -yes  Future visit scheduled -no  Last refill: 05/22/20  Notes to clinic: Call to patient- she wants to do her annual exam virtually- she states there is no reason for her to come to the office for face to face visit. Advised patient I would send her request to PCP for review and see if virtual visit is ok for annual exam. Patient wants call back and RF until appointment.  Requested Prescriptions  Pending Prescriptions Disp Refills   labetalol (NORMODYNE) 200 MG tablet [Pharmacy Med Name: LABETALOL HCL 200 MG TAB] 30 tablet 0    Sig: TAKE 1 TABLET BY MOUTH TWICE DAILY      Cardiovascular:  Beta Blockers Failed - 06/12/2020 10:24 AM      Failed - Valid encounter within last 6 months    Recent Outpatient Visits           1 year ago Annual physical exam   [facility] Trey Sailors, [place]   2 years ago Essential hypertension   [facility] Osvaldo Angst M, [place]   3 years ago Chronic hypertension   [facility] Osvaldo Angst M, [place]   4 years ago Chronic hypertension   [facility] Lorie Phenix, [place]              Passed - Last BP in normal range    BP Readings from Last 1 Encounters:  03/02/19 116/71          Passed - Last Heart Rate in normal range    Pulse Readings from Last 1 Encounters:  03/02/19 67              Requested Prescriptions  Pending Prescriptions Disp Refills   labetalol (NORMODYNE) 200 MG tablet [Pharmacy Med Name: LABETALOL HCL 200 MG TAB] 30 tablet 0    Sig: TAKE 1 TABLET BY MOUTH TWICE DAILY      Cardiovascular:  Beta Blockers Failed - 06/12/2020 10:24 AM      Failed - Valid encounter within last 6 months    Recent Outpatient Visits           1 year ago Annual physical exam   [facility] Trey Sailors, [place]   2 years ago Essential hypertension    [facility] Osvaldo Angst M, [place]   3 years ago Chronic hypertension   [facility] Osvaldo Angst M, [place]   4 years ago Chronic hypertension   [facility] Lorie Phenix, MD              Passed - Last BP in normal range    BP Readings from Last 1 Encounters:  03/02/19 116/71          Passed - Last Heart Rate in normal range    Pulse Readings from Last 1 Encounters:  03/02/19 67

## 2020-06-13 ENCOUNTER — Other Ambulatory Visit: Payer: Self-pay

## 2020-06-13 ENCOUNTER — Ambulatory Visit: Payer: 59 | Admitting: Physician Assistant

## 2020-06-13 ENCOUNTER — Encounter: Payer: Self-pay | Admitting: Physician Assistant

## 2020-06-13 VITALS — BP 133/93 | HR 71 | Temp 98.6°F | Ht 61.0 in | Wt 206.0 lb

## 2020-06-13 DIAGNOSIS — I10 Essential (primary) hypertension: Secondary | ICD-10-CM

## 2020-06-13 DIAGNOSIS — Z2821 Immunization not carried out because of patient refusal: Secondary | ICD-10-CM | POA: Diagnosis not present

## 2020-06-13 DIAGNOSIS — J309 Allergic rhinitis, unspecified: Secondary | ICD-10-CM | POA: Diagnosis not present

## 2020-06-13 MED ORDER — HYDROCHLOROTHIAZIDE 25 MG PO TABS: 25.0000 mg | ORAL_TABLET | Freq: Every day | ORAL | 3 refills | Status: DC

## 2020-06-13 MED ORDER — LABETALOL HCL 200 MG PO TABS: 200.0000 mg | ORAL_TABLET | Freq: Two times a day (BID) | ORAL | 3 refills | Status: DC

## 2020-06-13 MED ORDER — LORATADINE 10 MG PO TABS: 10.0000 mg | ORAL_TABLET | Freq: Every day | ORAL | 3 refills | Status: DC

## 2020-06-13 NOTE — Progress Notes (Signed)
Established patient visit   Patient: Rebecca Freeman   DOB: 07/14/1981   39 y.o. Female  MRN: [id number] Visit Date: 06/13/2020  Today's healthcare provider: Trey Sailors, PA-C   Chief Complaint  Patient presents with  . Hypertension   Subjective    HPI  Hypertension, follow-up  BP Readings from Last 3 Encounters:  06/13/20 (!) 133/93  03/02/19 116/71  03/09/18 121/76   Wt Readings from Last 3 Encounters:  06/13/20 206 lb (93.4 kg)  03/02/19 202 lb 9.6 oz (91.9 kg)  03/09/18 218 lb 12.8 oz (99.2 kg)     She was last seen for hypertension 1 years ago.  BP at that visit was 116/71. Management since that visit includes no changes.  She reports good compliance with treatment. She is not having side effects.  She is following a Regular diet. She is exercising. She does not smoke.  Use of agents associated with hypertension: none.   Outside blood pressures are checked occasionally. Reports that it averages in the 110s/70s.  Symptoms: No chest pain No chest pressure  No palpitations No syncope  No dyspnea No orthopnea  No paroxysmal nocturnal dyspnea No lower extremity edema   Pertinent labs: Lab Results  Component Value Date   CHOL 178 03/02/2019   HDL 51 03/02/2019   LDLCALC 100 (H) 03/02/2019   TRIG 134 03/02/2019   CHOLHDL 3.5 03/02/2019   Lab Results  Component Value Date   NA 140 03/02/2019   K 4.0 03/02/2019   CREATININE 1.12 (H) 03/02/2019   GFRNONAA 63 03/02/2019   GFRAA 73 03/02/2019   GLUCOSE 86 03/02/2019     The ASCVD Risk score (Goff DC Jr., et al., 2013) failed to calculate for the following reasons:   The 2013 ASCVD risk score is only valid for ages 97 to 33   Allergic Rhinitis: takes loratidine daily and gets this prescribed so it can be covered by [employer]SA. Does well with this medication.      Medications: Outpatient Medications Prior to Visit  Medication Sig  . Multiple Vitamin (MULTIVITAMIN) capsule Take 1 capsule by mouth  daily.  . [DISCONTINUED] hydrochlorothiazide (HYDRODIURIL) 25 MG tablet TAKE 1 TABLET BY MOUTH ONCE DAILY  . [DISCONTINUED] labetalol (NORMODYNE) 200 MG tablet TAKE 1 TABLET BY MOUTH TWICE DAILY  . [DISCONTINUED] loratadine (CLARITIN) 10 MG tablet TAKE 1 TABLET BY MOUTH ONCE DAILY   No facility-administered medications prior to visit.    Review of Systems  Constitutional: Negative.   Respiratory: Negative.   Cardiovascular: Negative.   Neurological: Negative.       Objective    BP (!) 133/93   Pulse 71   Temp 98.6 F (37 C)   Ht 5\' 1"  (1.549 m)   Wt 206 lb (93.4 kg)   BMI 38.92 kg/m    Physical Exam Constitutional:      Appearance: She is obese.  Cardiovascular:     Rate and Rhythm: Normal rate and regular rhythm.     Pulses: Normal pulses.     Heart sounds: Normal heart sounds.  Pulmonary:     Effort: Pulmonary effort is normal.     Breath sounds: Normal breath sounds.  Abdominal:     General: Bowel sounds are normal.  Skin:    General: Skin is warm and dry.  Neurological:     Mental Status: She is alert and oriented to person, place, and time. Mental status is at baseline.  Psychiatric:  Mood and Affect: Mood normal.        Behavior: Behavior normal.       No results found for any visits on 06/13/20.  Assessment & Plan    1. Essential hypertension  Slightly elevated, has been out of medicine as follow up has been > 1 year. Will refill as below and check labs. Follow up yearly.  - TSH - Lipid panel - Comprehensive metabolic panel - CBC with Differential/Platelet  2. Influenza vaccination declined  Refuses. Says her body reacts poorly to vaccines based on prior experience to feeling sick after a flu and pneumonia vaccine.   3. COVID-19 virus vaccination declined  Refuses. Says she had COVID and did fine with it. Says her body reacts poorly to vaccines based on prior experience to feeling sick after a flu and pneumonia vaccine.   4. Allergic  rhinitis, unspecified seasonality, unspecified trigger  - loratadine (CLARITIN) 10 MG tablet; Take 1 tablet (10 mg total) by mouth daily.  Dispense: 90 tablet; Refill: 3  5. Chronic hypertension  - labetalol (NORMODYNE) 200 MG tablet; Take 1 tablet (200 mg total) by mouth 2 (two) times daily.  Dispense: 180 tablet; Refill: 3 - hydrochlorothiazide (HYDRODIURIL) 25 MG tablet; Take 1 tablet (25 mg total) by mouth daily.  Dispense: 90 tablet; Refill: 3    Return in about 1 year (around 06/13/2021) for htn.      ITrey Sailors, PA-C, have reviewed all documentation for this visit. The documentation on 06/13/20 for the exam, diagnosis, procedures, and orders are all accurate and complete.  The entirety of the information documented in the History of Present Illness, Review of Systems and Physical Exam were personally obtained by me. Portions of this information were initially documented by Anson [place], CMA and reviewed by me for thoroughness and accuracy.     Rebecca Freeman  [facility] [phone] (phone) [phone] (fax)  [facility]

## 2020-06-14 ENCOUNTER — Encounter: Payer: Self-pay | Admitting: Physician Assistant

## 2020-06-14 LAB — COMPREHENSIVE METABOLIC PANEL
ALT: 21 IU/L (ref 0–32)
AST: 18 IU/L (ref 0–40)
Albumin/Globulin Ratio: 2 (ref 1.2–2.2)
Albumin: 4.7 g/dL (ref 3.8–4.8)
Alkaline Phosphatase: 47 IU/L (ref 44–121)
BUN/Creatinine Ratio: 14 (ref 9–23)
BUN: 15 mg/dL (ref 6–20)
Bilirubin Total: 0.5 mg/dL (ref 0.0–1.2)
CO2: 24 mmol/L (ref 20–29)
Calcium: 9.8 mg/dL (ref 8.7–10.2)
Chloride: 99 mmol/L (ref 96–106)
Creatinine, Ser: 1.09 mg/dL — ABNORMAL HIGH (ref 0.57–1.00)
GFR calc Af Amer: 74 mL/min/{1.73_m2} (ref >59)
GFR calc non Af Amer: 65 mL/min/{1.73_m2} (ref >59)
Globulin, Total: 2.3 g/dL (ref 1.5–4.5)
Glucose: 94 mg/dL (ref 65–99)
Potassium: 4.2 mmol/L (ref 3.5–5.2)
Sodium: 138 mmol/L (ref 134–144)
Total Protein: 7 g/dL (ref 6.0–8.5)

## 2020-06-14 LAB — CBC WITH DIFFERENTIAL/PLATELET
Basophils Absolute: 0 10*3/uL (ref 0.0–0.2)
Basos: 0 %
EOS (ABSOLUTE): 0.1 10*3/uL (ref 0.0–0.4)
Eos: 2 %
Hematocrit: 44.6 % (ref 34.0–46.6)
Hemoglobin: 15.3 g/dL (ref 11.1–15.9)
Immature Grans (Abs): 0 10*3/uL (ref 0.0–0.1)
Immature Granulocytes: 0 %
Lymphocytes Absolute: 1.3 10*3/uL (ref 0.7–3.1)
Lymphs: 23 %
MCH: 31.7 pg (ref 26.6–33.0)
MCHC: 34.3 g/dL (ref 31.5–35.7)
MCV: 92 fL (ref 79–97)
Monocytes Absolute: 0.5 10*3/uL (ref 0.1–0.9)
Monocytes: 9 %
Neutrophils Absolute: 3.7 10*3/uL (ref 1.4–7.0)
Neutrophils: 66 %
Platelets: 233 10*3/uL (ref 150–450)
RBC: 4.83 x10E6/uL (ref 3.77–5.28)
RDW: 12.9 % (ref 11.7–15.4)
WBC: 5.6 10*3/uL (ref 3.4–10.8)

## 2020-06-14 LAB — LIPID PANEL
Chol/HDL Ratio: 3.4 ratio (ref 0.0–4.4)
Cholesterol, Total: 209 mg/dL — ABNORMAL HIGH (ref 100–199)
HDL: 61 mg/dL (ref >39)
LDL Chol Calc (NIH): 131 mg/dL — ABNORMAL HIGH (ref 0–99)
Triglycerides: 97 mg/dL (ref 0–149)
VLDL Cholesterol Cal: 17 mg/dL (ref 5–40)

## 2020-06-14 LAB — TSH: TSH: 6.03 u[IU]/mL — ABNORMAL HIGH (ref 0.450–4.500)

## 2020-06-27 NOTE — Telephone Encounter (Signed)
Pt was seen in the office for this

## 2021-05-06 ENCOUNTER — Telehealth: Payer: Self-pay | Admitting: Physician Assistant

## 2021-05-06 DIAGNOSIS — J309 Allergic rhinitis, unspecified: Secondary | ICD-10-CM

## 2021-05-06 MED ORDER — LORATADINE 10 MG PO TABS: 10.0000 mg | ORAL_TABLET | Freq: Every day | ORAL | 3 refills | Status: DC

## 2021-05-06 NOTE — Telephone Encounter (Signed)
Converted to rf request

## 2021-05-06 NOTE — Telephone Encounter (Signed)
[facility] faxed refill request for the following medications:   loratadine (CLARITIN) 10 MG tablet   Please advise.

## 2021-05-16 HISTORY — PX: EYE SURGERY: SHX253

## 2021-06-13 ENCOUNTER — Ambulatory Visit: Payer: Self-pay | Admitting: Physician Assistant

## 2021-06-13 ENCOUNTER — Encounter: Payer: 59 | Admitting: Obstetrics and Gynecology

## 2021-06-17 ENCOUNTER — Ambulatory Visit: Payer: Self-pay | Admitting: Family Medicine

## 2021-07-02 ENCOUNTER — Telehealth: Payer: Self-pay | Admitting: Family Medicine

## 2021-07-02 DIAGNOSIS — I10 Essential (primary) hypertension: Secondary | ICD-10-CM

## 2021-07-02 MED ORDER — HYDROCHLOROTHIAZIDE 25 MG PO TABS: 25.0000 mg | ORAL_TABLET | Freq: Every day | ORAL | 0 refills | Status: DC

## 2021-07-02 NOTE — Telephone Encounter (Signed)
[facility] faxed refill request for the following medications:  hydrochlorothiazide (HYDRODIURIL) 25 MG tablet  Last Rx: 06/13/20 LOV: 06/13/20 with Adriana NOV: 07/25/21 with Dr. Leonard Schwartz Pt was scheduled for 06/17/21 but had to reschedule b/c provider was out of the office. Please advise. Thanks TNP

## 2021-07-03 ENCOUNTER — Other Ambulatory Visit: Payer: Self-pay | Admitting: Family Medicine

## 2021-07-03 DIAGNOSIS — I10 Essential (primary) hypertension: Secondary | ICD-10-CM

## 2021-07-03 MED ORDER — LABETALOL HCL 200 MG PO TABS: 200.0000 mg | ORAL_TABLET | Freq: Two times a day (BID) | ORAL | 0 refills | Status: DC

## 2021-07-03 NOTE — Telephone Encounter (Signed)
Medication Refill - Medication:  labetalol (NORMODYNE) 200 MG tablet  Has the patient contacted their pharmacy? Yes.   Contact PCP, only one medication was sent over.  Preferred Pharmacy (with phone number or street name):  [facility] - [place], [place] - [address]  [address] [place] [address]  Phone:  [phone]  Fax:  [phone]  Has the patient been seen for an appointment in the last year OR does the patient have an upcoming appointment? Yes.    Agent: Please be advised that RX refills may take up to 3 business days. We ask that you follow-up with your pharmacy.

## 2021-07-03 NOTE — Telephone Encounter (Signed)
Patient must keep upcoming appointment for further refills. Requested Prescriptions  Pending Prescriptions Disp Refills  . labetalol (NORMODYNE) 200 MG tablet 30 tablet 0    Sig: Take 1 tablet (200 mg total) by mouth 2 (two) times daily.     Cardiovascular:  Beta Blockers Failed - 07/03/2021  7:28 PM      Failed - Last BP in normal range    BP Readings from Last 1 Encounters:  06/13/20 (!) 133/93         Failed - Valid encounter within last 6 months    Recent Outpatient Visits          1 year ago Essential hypertension   [facility] Trey Sailors, [place]   2 years ago Annual physical exam   [facility] Osvaldo Angst M, [place]   3 years ago Essential hypertension   [facility] Osvaldo Angst M, [place]   4 years ago Chronic hypertension   [facility] Osvaldo Angst M, [place]   5 years ago Chronic hypertension   [facility] Lorie Phenix, [place]      Future Appointments            In 3 weeks Bacigalupo, Marzella Schlein, MD [facility]   In 1 month Hildred Laser, MD [facility] - Last Heart Rate in normal range    Pulse Readings from Last 1 Encounters:  06/13/20 71

## 2021-07-18 ENCOUNTER — Other Ambulatory Visit: Payer: Self-pay | Admitting: Family Medicine

## 2021-07-18 DIAGNOSIS — I10 Essential (primary) hypertension: Secondary | ICD-10-CM

## 2021-07-19 ENCOUNTER — Other Ambulatory Visit: Payer: Self-pay | Admitting: Family Medicine

## 2021-07-19 ENCOUNTER — Telehealth: Payer: Self-pay | Admitting: Family Medicine

## 2021-07-19 DIAGNOSIS — I10 Essential (primary) hypertension: Secondary | ICD-10-CM

## 2021-07-19 MED ORDER — LABETALOL HCL 200 MG PO TABS: 200.0000 mg | ORAL_TABLET | Freq: Two times a day (BID) | ORAL | 0 refills | Status: DC

## 2021-07-19 NOTE — Telephone Encounter (Signed)
Requested medication (s) are due for refill today:   Yes  Requested medication (s) are on the active medication list:   Yes  Future visit scheduled:   Yes 07/25/2021 Dr. Beryle Flock   Last ordered: 07/03/2021 #30, 0 refills  Pt requested a refill because going out of town.   She came up short because takes these twice a day.    Returned for provider to review for refills prior to appt since courtesy refill has been given on 10/19.   Requested Prescriptions  Pending Prescriptions Disp Refills   labetalol (NORMODYNE) 200 MG tablet [Pharmacy Med Name: LABETALOL HCL 200 MG TAB] 30 tablet 0    Sig: TAKE 1 TABLET BY MOUTH TWICE DAILY     Cardiovascular:  Beta Blockers Failed - 07/19/2021 10:26 AM      Failed - Last BP in normal range    BP Readings from Last 1 Encounters:  06/13/20 (!) 133/93          Failed - Valid encounter within last 6 months    Recent Outpatient Visits           1 year ago Essential hypertension   [facility] Trey Sailors, [place]   2 years ago Annual physical exam   [facility] Osvaldo Angst M, [place]   3 years ago Essential hypertension   [facility] Osvaldo Angst M, [place]   4 years ago Chronic hypertension   [facility] Osvaldo Angst M, [place]   5 years ago Chronic hypertension   [facility] Lorie Phenix, [place]       Future Appointments             In 6 days Bacigalupo, Marzella Schlein, MD [facility]   In 1 month Hildred Laser, MD [facility] - Last Heart Rate in normal range    Pulse Readings from Last 1 Encounters:  06/13/20 71

## 2021-07-19 NOTE — Telephone Encounter (Signed)
Ordered on 07/19/21 #30.

## 2021-07-19 NOTE — Telephone Encounter (Signed)
Pt has made several mychart and pharmacy request for her refill for LABETALOL / pt was advised request was denied and called to see why/ she stated that she was told that it would not be denied due to her having an appt next week that was rescheduled by the provider / please advise / I advised pt it could be due to the amount of request

## 2021-07-23 NOTE — Progress Notes (Signed)
Established patient visit   Patient: Rebecca Freeman   DOB: 1981/08/11   40 y.o. Female  MRN: [id number] Visit Date: 07/25/2021  Today's healthcare provider: Shirlee Latch, MD   Chief Complaint  Patient presents with   Hypertension    Subjective    HPI  Hypertension, follow-up  BP Readings from Last 3 Encounters:  07/25/21 (!) 136/100  06/13/20 (!) 133/93  03/02/19 116/71   Wt Readings from Last 3 Encounters:  07/25/21 213 lb (96.6 kg)  06/13/20 206 lb (93.4 kg)  03/02/19 202 lb 9.6 oz (91.9 kg)     She was last seen for hypertension 1 years ago.  BP at that visit was 133/93. Management since that visit includes no changes.  She reports excellent compliance with treatment. She is not having side effects.  She is following a Regular diet. She is exercising. She does not smoke.  Patient reports that in the past she used to take two BP medications, patient is wondering if she needs to add another medication to HTCZ. Patient reports she has had a few episodes of palpitations. Not sure if its related to hypertension or panic attack. She reports first episode happened at [employer] [place] 1 yr ago. Happens more when she is traveling, stressed, or busy. Gotten better over the last 2 months. Had an episode last night though.    Outside blood pressures are not being checked. Symptoms: No chest pain No chest pressure  Yes palpitations No syncope  No dyspnea No orthopnea  No paroxysmal nocturnal dyspnea No lower extremity edema   Pertinent labs: Lab Results  Component Value Date   CHOL 209 (H) 06/13/2020   HDL 61 06/13/2020   LDLCALC 131 (H) 06/13/2020   TRIG 97 06/13/2020   CHOLHDL 3.4 06/13/2020   Lab Results  Component Value Date   NA 138 06/13/2020   K 4.2 06/13/2020   CREATININE 1.09 (H) 06/13/2020   GFRNONAA 65 06/13/2020   GLUCOSE 94 06/13/2020   TSH 6.030 (H) 06/13/2020     The ASCVD Risk score (Arnett DK, et al., 2019) failed to calculate for the  following reasons:   The 2019 ASCVD risk score is only valid for ages 81 to 11   ---------------------------------------------------------------------------------------------------     Medications: Outpatient Medications Prior to Visit  Medication Sig   loratadine (CLARITIN) 10 MG tablet Take 1 tablet (10 mg total) by mouth daily.   Multiple Vitamin (MULTIVITAMIN) capsule Take 1 capsule by mouth daily.   [DISCONTINUED] hydrochlorothiazide (HYDRODIURIL) 25 MG tablet Take 1 tablet (25 mg total) by mouth daily.   [DISCONTINUED] labetalol (NORMODYNE) 200 MG tablet Take 1 tablet (200 mg total) by mouth 2 (two) times daily.   [DISCONTINUED] labetalol (NORMODYNE) 200 MG tablet TAKE 1 TABLET BY MOUTH TWICE DAILY (Patient not taking: Reported on 07/25/2021)   No facility-administered medications prior to visit.    Review of Systems  Constitutional: Negative.   Respiratory: Negative.    Cardiovascular:  Positive for palpitations.       Objective    BP (!) 136/100 (BP Location: Left Arm, Patient Position: Sitting, Cuff Size: Large)   Pulse 79   Temp (!) 97.2 F (36.2 C) (Temporal)   Resp 16   Ht 5\' 1"  (1.549 m)   Wt 213 lb (96.6 kg)   SpO2 96%   BMI 40.25 kg/m  BP Readings from Last 3 Encounters:  07/25/21 (!) 136/100  06/13/20 (!) 133/93  03/02/19 116/71   Wt  Readings from Last 3 Encounters:  07/25/21 213 lb (96.6 kg)  06/13/20 206 lb (93.4 kg)  03/02/19 202 lb 9.6 oz (91.9 kg)      Physical Exam Vitals reviewed.  Constitutional:      General: She is not in acute distress.    Appearance: Normal appearance. She is well-developed. She is not diaphoretic.  HENT:     Head: Normocephalic and atraumatic.  Eyes:     General: No scleral icterus.    Conjunctiva/sclera: Conjunctivae normal.  Neck:     Thyroid: No thyromegaly.  Cardiovascular:     Rate and Rhythm: Normal rate and regular rhythm.     Pulses: Normal pulses.     Heart sounds: Normal heart sounds. No murmur  heard. Pulmonary:     Effort: Pulmonary effort is normal. No respiratory distress.     Breath sounds: Normal breath sounds. No wheezing, rhonchi or rales.  Musculoskeletal:     Cervical back: Neck supple.     Right lower leg: No edema.     Left lower leg: No edema.  Lymphadenopathy:     Cervical: No cervical adenopathy.  Skin:    General: Skin is warm and dry.     Findings: No rash.  Neurological:     Mental Status: She is alert and oriented to person, place, and time. Mental status is at baseline.  Psychiatric:        Mood and Affect: Mood normal.        Behavior: Behavior normal.      No results found for any visits on 07/25/21.  Assessment & Plan     Problem List Items Addressed This Visit       Cardiovascular and Mediastinum   Hypertension - Primary    Chronic and uncontrolled Goal less than 130/80 Continue HCTZ and labetalol Add amlodipine 5 mg daily Recheck metabolic panel Follow-up in 1 to 2 months and consider dose titration of amlodipine In the future, may be able to stay on HCTZ and amlodipine and get off of labetalol      Relevant Medications   amLODipine (NORVASC) 5 MG tablet   hydrochlorothiazide (HYDRODIURIL) 25 MG tablet   labetalol (NORMODYNE) 200 MG tablet   Other Relevant Orders   Comprehensive metabolic panel     Endocrine   Subclinical hypothyroidism    Not on meds Recheck TSH and free T4      Relevant Medications   labetalol (NORMODYNE) 200 MG tablet   Other Relevant Orders   TSH + free T4     Other   Avitaminosis D    Recheck levels      Relevant Orders   VITAMIN D 25 Hydroxy (Vit-D Deficiency, Fractures)   Morbid obesity (HCC)    Discussed importance of healthy weight management Discussed diet and exercise       Palpitations    Ongoing, intermittent problem Seems to be related to times of stress Will check CMP, CBC, TSH to look for underlying causes today As it is quite infrequent and seems to be tied to stress, patient  and I agreed not to proceed with Zio patch yet, but if it worsens in the future consider this versus cardiology referral Discussed stress reduction techniques      Relevant Orders   TSH + free T4   CBC   Moderate mixed hyperlipidemia not requiring statin therapy    Reviewed last lipid panel Not currently on a statin Recheck FLP and CMP Discussed diet and exercise  Relevant Medications   amLODipine (NORVASC) 5 MG tablet   hydrochlorothiazide (HYDRODIURIL) 25 MG tablet   labetalol (NORMODYNE) 200 MG tablet   Other Relevant Orders   Comprehensive metabolic panel   Lipid panel   Other Visit Diagnoses     Need for hepatitis C screening test       Relevant Orders   Hepatitis C Antibody   Chronic hypertension       Relevant Medications   amLODipine (NORVASC) 5 MG tablet   hydrochlorothiazide (HYDRODIURIL) 25 MG tablet   labetalol (NORMODYNE) 200 MG tablet        Return in about 4 weeks (around 08/22/2021) for BP f/u, With new PCP.      I, Shirlee Latch, MD, have reviewed all documentation for this visit. The documentation on 07/26/21 for the exam, diagnosis, procedures, and orders are all accurate and complete.   Jamaine Quintin, Marzella Schlein, MD, MPH [facility]

## 2021-07-25 ENCOUNTER — Other Ambulatory Visit: Payer: Self-pay

## 2021-07-25 ENCOUNTER — Encounter: Payer: Self-pay | Admitting: Family Medicine

## 2021-07-25 ENCOUNTER — Ambulatory Visit: Payer: 59 | Admitting: Family Medicine

## 2021-07-25 VITALS — BP 136/100 | HR 79 | Temp 97.2°F | Resp 16 | Ht 61.0 in | Wt 213.0 lb

## 2021-07-25 DIAGNOSIS — Z1159 Encounter for screening for other viral diseases: Secondary | ICD-10-CM

## 2021-07-25 DIAGNOSIS — E038 Other specified hypothyroidism: Secondary | ICD-10-CM | POA: Diagnosis not present

## 2021-07-25 DIAGNOSIS — E782 Mixed hyperlipidemia: Secondary | ICD-10-CM

## 2021-07-25 DIAGNOSIS — E559 Vitamin D deficiency, unspecified: Secondary | ICD-10-CM | POA: Diagnosis not present

## 2021-07-25 DIAGNOSIS — R002 Palpitations: Secondary | ICD-10-CM

## 2021-07-25 DIAGNOSIS — I10 Essential (primary) hypertension: Secondary | ICD-10-CM | POA: Diagnosis not present

## 2021-07-25 MED ORDER — HYDROCHLOROTHIAZIDE 25 MG PO TABS: 25.0000 mg | ORAL_TABLET | Freq: Every day | ORAL | 1 refills | Status: DC

## 2021-07-25 MED ORDER — AMLODIPINE BESYLATE 5 MG PO TABS: 5.0000 mg | ORAL_TABLET | Freq: Every day | ORAL | 2 refills | Status: DC

## 2021-07-25 MED ORDER — LABETALOL HCL 200 MG PO TABS: 200.0000 mg | ORAL_TABLET | Freq: Two times a day (BID) | ORAL | 1 refills | Status: DC

## 2021-07-25 NOTE — Assessment & Plan Note (Signed)
Not on meds Recheck TSH and free T4

## 2021-07-25 NOTE — Assessment & Plan Note (Signed)
Discussed importance of healthy weight management Discussed diet and exercise  

## 2021-07-25 NOTE — Assessment & Plan Note (Signed)
Recheck levels 

## 2021-07-26 NOTE — Assessment & Plan Note (Signed)
Chronic and uncontrolled Goal less than 130/80 Continue HCTZ and labetalol Add amlodipine 5 mg daily Recheck metabolic panel Follow-up in 1 to 2 months and consider dose titration of amlodipine In the future, may be able to stay on HCTZ and amlodipine and get off of labetalol

## 2021-07-26 NOTE — Assessment & Plan Note (Signed)
Ongoing, intermittent problem Seems to be related to times of stress Will check CMP, CBC, TSH to look for underlying causes today As it is quite infrequent and seems to be tied to stress, patient and I agreed not to proceed with Zio patch yet, but if it worsens in the future consider this versus cardiology referral Discussed stress reduction techniques

## 2021-07-26 NOTE — Assessment & Plan Note (Signed)
Reviewed last lipid panel Not currently on a statin Recheck FLP and CMP Discussed diet and exercise  

## 2021-07-27 LAB — COMPREHENSIVE METABOLIC PANEL
ALT: 22 IU/L (ref 0–32)
AST: 20 IU/L (ref 0–40)
Albumin/Globulin Ratio: 1.7 (ref 1.2–2.2)
Albumin: 4.7 g/dL (ref 3.8–4.8)
Alkaline Phosphatase: 56 IU/L (ref 44–121)
BUN/Creatinine Ratio: 15 (ref 9–23)
BUN: 16 mg/dL (ref 6–20)
Bilirubin Total: 0.5 mg/dL (ref 0.0–1.2)
CO2: 25 mmol/L (ref 20–29)
Calcium: 9.9 mg/dL (ref 8.7–10.2)
Chloride: 97 mmol/L (ref 96–106)
Creatinine, Ser: 1.04 mg/dL — ABNORMAL HIGH (ref 0.57–1.00)
Globulin, Total: 2.7 g/dL (ref 1.5–4.5)
Glucose: 87 mg/dL (ref 70–99)
Potassium: 3.8 mmol/L (ref 3.5–5.2)
Sodium: 141 mmol/L (ref 134–144)
Total Protein: 7.4 g/dL (ref 6.0–8.5)
eGFR: 70 mL/min/{1.73_m2} (ref >59)

## 2021-07-27 LAB — CBC
Hematocrit: 45.3 % (ref 34.0–46.6)
Hemoglobin: 15.3 g/dL (ref 11.1–15.9)
MCH: 31.2 pg (ref 26.6–33.0)
MCHC: 33.8 g/dL (ref 31.5–35.7)
MCV: 92 fL (ref 79–97)
Platelets: 266 10*3/uL (ref 150–450)
RBC: 4.91 x10E6/uL (ref 3.77–5.28)
RDW: 12.4 % (ref 11.7–15.4)
WBC: 6.9 10*3/uL (ref 3.4–10.8)

## 2021-07-27 LAB — LIPID PANEL
Chol/HDL Ratio: 3.9 ratio (ref 0.0–4.4)
Cholesterol, Total: 240 mg/dL — ABNORMAL HIGH (ref 100–199)
HDL: 61 mg/dL (ref >39)
LDL Chol Calc (NIH): 161 mg/dL — ABNORMAL HIGH (ref 0–99)
Triglycerides: 103 mg/dL (ref 0–149)
VLDL Cholesterol Cal: 18 mg/dL (ref 5–40)

## 2021-07-27 LAB — HEPATITIS C ANTIBODY: Hep C Virus Ab: 0.1 s/co ratio (ref 0.0–0.9)

## 2021-07-27 LAB — TSH+FREE T4
Free T4: 1.49 ng/dL (ref 0.82–1.77)
TSH: 4.08 u[IU]/mL (ref 0.450–4.500)

## 2021-07-27 LAB — VITAMIN D 25 HYDROXY (VIT D DEFICIENCY, FRACTURES): Vit D, 25-Hydroxy: 40.8 ng/mL (ref 30.0–100.0)

## 2021-08-20 ENCOUNTER — Ambulatory Visit (INDEPENDENT_AMBULATORY_CARE_PROVIDER_SITE_OTHER): Payer: 59 | Admitting: Obstetrics and Gynecology

## 2021-08-20 ENCOUNTER — Other Ambulatory Visit: Payer: Self-pay

## 2021-08-20 ENCOUNTER — Encounter: Payer: Self-pay | Admitting: Obstetrics and Gynecology

## 2021-08-20 ENCOUNTER — Other Ambulatory Visit (HOSPITAL_COMMUNITY)
Admission: RE | Admit: 2021-08-20 | Discharge: 2021-08-20 | Disposition: A | Discharge status: Home / Self Care | Payer: 59 | Source: Ambulatory Visit | Attending: Obstetrics and Gynecology | Admitting: Obstetrics and Gynecology

## 2021-08-20 VITALS — BP 119/87 | HR 73 | Ht 61.0 in | Wt 211.1 lb

## 2021-08-20 DIAGNOSIS — I10 Essential (primary) hypertension: Secondary | ICD-10-CM

## 2021-08-20 DIAGNOSIS — Z124 Encounter for screening for malignant neoplasm of cervix: Secondary | ICD-10-CM

## 2021-08-20 DIAGNOSIS — E038 Other specified hypothyroidism: Secondary | ICD-10-CM

## 2021-08-20 DIAGNOSIS — E669 Obesity, unspecified: Secondary | ICD-10-CM

## 2021-08-20 DIAGNOSIS — Z1231 Encounter for screening mammogram for malignant neoplasm of breast: Secondary | ICD-10-CM | POA: Diagnosis not present

## 2021-08-20 DIAGNOSIS — Z01419 Encounter for gynecological examination (general) (routine) without abnormal findings: Secondary | ICD-10-CM

## 2021-08-20 NOTE — Patient Instructions (Addendum)
Preventive Care 5-39 Years Old, Female Preventive care refers to lifestyle choices and visits with your health care provider that can promote health and wellness. Preventive care visits are also called wellness exams. What can I expect for my preventive care visit? Counseling During your preventive care visit, your health care provider may ask about your: Medical history, including: Past medical problems. Family medical history. Pregnancy history. Current health, including: Menstrual cycle. Method of birth control. Emotional well-being. Home life and relationship well-being. Sexual activity and sexual health. Lifestyle, including: Alcohol, nicotine or tobacco, and drug use. Access to firearms. Diet, exercise, and sleep habits. Work and work Statistician. Sunscreen use. Safety issues such as seatbelt and bike helmet use. Physical exam Your health care provider may check your: Height and weight. These may be used to calculate your BMI (body mass index). BMI is a measurement that tells if you are at a healthy weight. Waist circumference. This measures the distance around your waistline. This measurement also tells if you are at a healthy weight and may help predict your risk of certain diseases, such as type 2 diabetes and high blood pressure. Heart rate and blood pressure. Body temperature. Skin for abnormal spots. What immunizations do I need? Vaccines are usually given at various ages, according to a schedule. Your health care provider will recommend vaccines for you based on your age, medical history, and lifestyle or other factors, such as travel or where you work. What tests do I need? Screening Your health care provider may recommend screening tests for certain conditions. This may include: Pelvic exam and Pap test. Lipid and cholesterol levels. Diabetes screening. This is done by checking your blood sugar (glucose) after you have not eaten for a while (fasting). Hepatitis B  test. Hepatitis C test. HIV (human immunodeficiency virus) test. STI (sexually transmitted infection) testing, if you are at risk. BRCA-related cancer screening. This may be done if you have a family history of breast, ovarian, tubal, or peritoneal cancers. Talk with your health care provider about your test results, treatment options, and if necessary, the need for more tests. Follow these instructions at home: Eating and drinking  Eat a healthy diet that includes fresh fruits and vegetables, whole grains, lean protein, and low-fat dairy products. Take vitamin and mineral supplements as recommended by your health care provider. Do not drink alcohol if: Your health care provider tells you not to drink. You are pregnant, may be pregnant, or are planning to become pregnant. If you drink alcohol: Limit how much you have to 0-1 drink a day. Know how much alcohol is in your drink. In the [place] one drink equals one 12 oz bottle of beer (355 mL), one 5 oz glass of wine (148 mL), or one 1 oz glass of hard liquor (44 mL). Lifestyle Brush your teeth every morning and night with fluoride toothpaste. Floss one time each day. Exercise for at least 30 minutes 5 or more days each week. Do not use any products that contain nicotine or tobacco. These products include cigarettes, chewing tobacco, and vaping devices, such as e-cigarettes. If you need help quitting, ask your health care provider. Do not use drugs. If you are sexually active, practice safe sex. Use a condom or other form of protection to prevent STIs. If you do not wish to become pregnant, use a form of birth control. If you plan to become pregnant, see your health care provider for a prepregnancy visit. Find healthy ways to manage stress, such as: Meditation, yoga,  or listening to music. °Journaling. °Talking to a trusted person. °Spending time with friends and family. °Minimize exposure to UV radiation to reduce your risk of skin  cancer. °Safety °Always wear your seat belt while driving or riding in a vehicle. °Do not drive: °If you have been drinking alcohol. Do not ride with someone who has been drinking. °If you have been using any mind-altering substances or drugs. °While texting. °When you are tired or distracted. °Wear a helmet and other protective equipment during sports activities. °If you have firearms in your house, make sure you follow all gun safety procedures. °Seek help if you have been physically or sexually abused. °What's next? °Go to your health care provider once a year for an annual wellness visit. °Ask your health care provider how often you should have your eyes and teeth checked. °Stay up to date on all vaccines. °This information is not intended to replace advice given to you by your health care provider. Make sure you discuss any questions you have with your health care provider. °Document Revised: 02/27/2021 Document Reviewed: 02/27/2021 °Elsevier Patient Education © 2022 [employer] Inc. ° °

## 2021-08-20 NOTE — Progress Notes (Signed)
GYNECOLOGY ANNUAL PHYSICAL EXAM PROGRESS NOTE  Subjective:    Rebecca Freeman is a 40 y.o. [phone] female who presents for an annual exam. Previously a patient of Dr. Hassell Done Defrancesco, last seen in 2019The patient has no complaints today. The patient is sexually active. The patient participates in regular exercise: yes. Has the patient ever been transfused or tattooed?: no. The patient reports that there is not domestic violence in her life.    Menstrual History: Menarche age: 65 or 40 Patient's last menstrual period was 08/11/2021 (exact date). Period Duration (Days): 4-6 Period Pattern: Regular Menstrual Flow: Moderate Menstrual Control: Tampon Menstrual Control Change Freq (Hours): 2-3 Dysmenorrhea: None   Gynecologic History:  Contraception: tubal ligation History of STI's: Denies Last Pap: 02/11/2017. Results were: normal.  Denies abnormal pap smears. Last mammogram: Due to begin at age 10.     OB History  Gravida Para Term Preterm AB Living  4 2 1  0 2 2  SAB IAB Ectopic Multiple Live Births  2 0 0 0 2    # Outcome Date GA Lbr Len/2nd Weight Sex Delivery Anes PTL Lv  4 Para 01/15/15   6 lb 9.1 oz (2.98 kg) F CS-LTranv Spinal  LIV     Apgar1: 7  Apgar5: 9  3 Term 2012   6 lb 8 oz (2.948 kg) M CS-LTranv   LIV  2 SAB           1 SAB             Past Medical History:  Diagnosis Date   Anemia    Anemia    Hematuria    Hypertension    Hypertension    PONV (postoperative nausea and vomiting)     Past Surgical History:  Procedure Laterality Date   bilateral clubfoot surgery Bilateral    CESAREAN SECTION     CESAREAN SECTION N/A 01/15/2015   Procedure: REPEAT CESAREAN SECTION AND BILATERAL TUBAL LIGATION ;  Surgeon: Brayton Mars, MD;  Location: [facility] ORS;  Service: Obstetrics;  Laterality: N/A;   EYE SURGERY Bilateral 05/2021   LASIK surgery Dr. 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[employer]  Tobacco Use   Smoking status: Never   Smokeless tobacco: Never  Vaping Use   Vaping Use: Never used  Substance and Sexual Activity   Alcohol use: Yes    Alcohol/week: 0.0 standard drinks    Comment: occas   Drug use: No   Sexual activity: Yes    Partners: Male    Birth control/protection: Surgical  Other Topics Concern   Not on file  Social History Narrative   Not on file   Social Determinants of Health   Financial Resource Strain: Not on file  Food Insecurity: Not on  file  Transportation Needs: Not on file  Physical Activity: Not on file  Stress: Not on file  Social Connections: Not on file  Intimate Partner Violence: Not on file    Current Outpatient Medications on File Prior to Visit  Medication Sig Dispense Refill   amLODipine (NORVASC) 5 MG tablet Take 1 tablet (5 mg total) by mouth daily. 30 tablet 2   hydrochlorothiazide (HYDRODIURIL) 25 MG tablet Take 1 tablet (25 mg total) by mouth daily. 90 tablet 1   labetalol (NORMODYNE) 200 MG tablet Take 1 tablet (200 mg total) by mouth 2 (two) times daily. 180 tablet 1   loratadine (CLARITIN) 10 MG tablet Take 1 tablet (10 mg total) by mouth daily. 90 tablet 3   Multiple Vitamin (MULTIVITAMIN) capsule Take 1 capsule by mouth daily.     No current facility-administered medications on file prior to visit.    Allergies  Allergen Reactions   Morphine And Related Itching and Anxiety     Review of Systems Constitutional: negative for chills, fatigue, fevers and sweats Eyes: negative  for irritation, redness and visual disturbance Ears, nose, mouth, throat, and face: negative for hearing loss, nasal congestion, snoring and tinnitus Respiratory: negative for asthma, cough, sputum Cardiovascular: negative for chest pain, dyspnea, exertional chest pressure/discomfort, irregular heart beat, palpitations and syncope Gastrointestinal: negative for abdominal pain, change in bowel habits, nausea and vomiting Genitourinary: negative for abnormal menstrual periods, genital lesions, sexual problems and vaginal discharge, dysuria and urinary incontinence Integument/breast: negative for breast lump, breast tenderness and nipple discharge Hematologic/lymphatic: negative for bleeding and easy bruising Musculoskeletal:negative for back pain and muscle weakness Neurological: negative for dizziness, headaches, vertigo and weakness Endocrine: negative for diabetic symptoms including polydipsia, polyuria and skin dryness Allergic/Immunologic: negative for hay fever and urticaria      Objective:  Blood pressure 119/87, pulse 73, height 5\' 1"  (1.549 m), weight 211 lb 1.6 oz (95.8 kg), last menstrual period 08/11/2021. Body mass index is 39.89 kg/m.   General Appearance:    Alert, cooperative, no distress, appears stated age, moderate obesity  Head:    Normocephalic, without obvious abnormality, atraumatic  Eyes:    PERRL, conjunctiva/corneas clear, EOM's intact, both eyes  Ears:    Normal external ear canals, both ears  Nose:   Nares normal, septum midline, mucosa normal, no drainage or sinus tenderness  Throat:   Lips, mucosa, and tongue normal; teeth and gums normal  Neck:   Supple, symmetrical, trachea midline, no adenopathy; thyroid: no enlargement/tenderness/nodules; no carotid bruit or JVD  Back:     Symmetric, no curvature, ROM normal, no CVA tenderness  Lungs:     Clear to auscultation bilaterally, respirations unlabored  Chest Wall:    No tenderness or deformity   Heart:    Regular  rate and rhythm, S1 and S2 normal, no murmur, rub or gallop  Breast Exam:    No tenderness, masses, or nipple abnormality  Abdomen:     Soft, non-tender, bowel sounds active all four quadrants, no masses, no organomegaly.    Genitalia:    Pelvic:external genitalia normal, vagina without lesions, discharge, or tenderness, rectovaginal septum  normal. Cervix normal in appearance, no cervical motion tenderness, no adnexal masses or tenderness.  Uterus normal size, shape, mobile, regular contours, nontender.  Rectal:    Normal external sphincter.  No hemorrhoids appreciated. Internal exam not done.   Extremities:   Extremities normal, atraumatic, no cyanosis or edema  Pulses:   2+ and symmetric all extremities  Skin:   Skin color, texture, turgor normal, no rashes or lesions  Lymph nodes:   Cervical, supraclavicular, and axillary nodes normal  Neurologic:   CNII-XII intact, normal strength, sensation and reflexes throughout   .  Labs:  Lab Results  Component Value Date   WBC 6.9 07/26/2021   HGB 15.3 07/26/2021   HCT 45.3 07/26/2021   MCV 92 07/26/2021   PLT 266 07/26/2021    Lab Results  Component Value Date   CREATININE 1.04 (H) 07/26/2021   BUN 16 07/26/2021   NA 141 07/26/2021   K 3.8 07/26/2021   CL 97 07/26/2021   CO2 25 07/26/2021    Lab Results  Component Value Date   ALT 22 07/26/2021   AST 20 07/26/2021   ALKPHOS 56 07/26/2021   BILITOT 0.5 07/26/2021    Lab Results  Component Value Date   TSH 4.080 07/26/2021     Assessment:   1. Encounter for well woman exam with routine gynecological exam   2. Encounter for screening mammogram for malignant neoplasm of breast   3. Cervical cancer screening   4. Obesity (BMI 30-39.9)   5. Subclinical hypothyroidism   6. Primary hypertension      Plan:  Blood tests: None ordered. Patient has labs with PCP. Breast self exam technique reviewed and patient encouraged to perform self-exam monthly. Contraception: tubal  ligation. Discussed healthy lifestyle modifications. Mammogram Due to begin screening next year, ordered.  Pap smear  Performed today . Discussed that she was eligible for q 5 year screens but still desires to perform today.  COVID vaccination status: Declines Flu vaccine: Declines Subclinical hypothyroidism and HTN managed by PCP.  Follow up in 1 year for annual exam   Hildred Laser, MD [facility]

## 2021-08-22 LAB — CYTOLOGY - PAP
Comment: NEGATIVE
Diagnosis: NEGATIVE
High risk HPV: NEGATIVE

## 2021-08-26 ENCOUNTER — Ambulatory Visit: Payer: 59 | Admitting: Family Medicine

## 2021-08-26 NOTE — Progress Notes (Deleted)
      Established patient visit   Patient: Rebecca Freeman   DOB: July 29, 1981   40 y.o. Female  MRN: [id number] Visit Date: 08/26/2021  Today's healthcare provider: Gwyneth Sprout, FNP   No chief complaint on file.  Subjective    HPI  Hypertension, follow-up  BP Readings from Last 3 Encounters:  08/20/21 119/87  07/25/21 (!) 136/100  06/13/20 (!) 133/93   Wt Readings from Last 3 Encounters:  08/20/21 211 lb 1.6 oz (95.8 kg)  07/25/21 213 lb (96.6 kg)  06/13/20 206 lb (93.4 kg)     She was last seen for hypertension 1 months ago.  BP at that visit was 136/100. Management since that visit includes goal less than 130/80 continue HCTZ and labetalol and add amlodipine 5 mg daily.  She reports {excellent/good/fair/poor:19665} compliance with treatment. She {is/is not:9024} having side effects. {document side effects if present:1} She is following a {diet:21022986} diet. She {is/is not:9024} exercising. She {does/does not:200015} smoke.  Use of agents associated with hypertension: {bp agents assoc with hypertension:511::"none"}.   Outside blood pressures are {***enter patient reported home BP readings, or 'not being checked':1}. Symptoms: {Yes/No:20286} chest pain {Yes/No:20286} chest pressure  {Yes/No:20286} palpitations {Yes/No:20286} syncope  {Yes/No:20286} dyspnea {Yes/No:20286} orthopnea  {Yes/No:20286} paroxysmal nocturnal dyspnea {Yes/No:20286} lower extremity edema   Pertinent labs: Lab Results  Component Value Date   CHOL 240 (H) 07/26/2021   HDL 61 07/26/2021   LDLCALC 161 (H) 07/26/2021   TRIG 103 07/26/2021   CHOLHDL 3.9 07/26/2021   Lab Results  Component Value Date   NA 141 07/26/2021   K 3.8 07/26/2021   CREATININE 1.04 (H) 07/26/2021   EGFR 70 07/26/2021   GLUCOSE 87 07/26/2021   TSH 4.080 07/26/2021     The ASCVD Risk score (Arnett DK, et al., 2019) failed to calculate for the following reasons:   The 2019 ASCVD risk score is only valid for ages  23 to 7   ---------------------------------------------------------------------------------------------------   {Link to patient history deactivated due to formatting error:1}  Medications: Outpatient Medications Prior to Visit  Medication Sig   amLODipine (NORVASC) 5 MG tablet Take 1 tablet (5 mg total) by mouth daily.   hydrochlorothiazide (HYDRODIURIL) 25 MG tablet Take 1 tablet (25 mg total) by mouth daily.   labetalol (NORMODYNE) 200 MG tablet Take 1 tablet (200 mg total) by mouth 2 (two) times daily.   loratadine (CLARITIN) 10 MG tablet Take 1 tablet (10 mg total) by mouth daily.   Multiple Vitamin (MULTIVITAMIN) capsule Take 1 capsule by mouth daily.   No facility-administered medications prior to visit.    Review of Systems  {Labs  Heme  Chem  Endocrine  Serology  Results Review (optional):23779}   Objective    LMP 08/11/2021 (Exact Date)  {Show previous vital signs (optional):23777}  Physical Exam  ***  No results found for any visits on 08/26/21.  Assessment & Plan     ***  No follow-ups on file.      {provider attestation***:1}   Gwyneth Sprout, [place] [phone] (phone) [phone] (fax)  [place]

## 2021-09-04 NOTE — Progress Notes (Signed)
Established patient visit   Patient: Rebecca Freeman   DOB: Jun 04, 1981   40 y.o. Female  MRN: [id number] Visit Date: 09/05/2021  Today's healthcare provider: Lavon Paganini, MD   Chief Complaint  Patient presents with   Hypertension   Subjective    HPI  Hypertension, follow-up  BP Readings from Last 3 Encounters:  09/05/21 123/79  08/20/21 119/87  07/25/21 (!) 136/100   Wt Readings from Last 3 Encounters:  09/05/21 211 lb (95.7 kg)  08/20/21 211 lb 1.6 oz (95.8 kg)  07/25/21 213 lb (96.6 kg)     She was last seen for hypertension 1 months ago.  BP at that visit was 136/100. Management since that visit includes Continue HCTZ and labetalol Add amlodipine 5 mg daily Recheck metabolic panel Follow-up in 1 to 2 months and consider dose titration of amlodipine.  She reports good compliance with treatment. She is not having side effects.  She is following a Regular diet. She is not exercising. She does not smoke.  Use of agents associated with hypertension: none.   Outside blood pressures are not checked. Symptoms: No chest pain No chest pressure  No palpitations No syncope  No dyspnea No orthopnea  No paroxysmal nocturnal dyspnea No lower extremity edema   Pertinent labs: Lab Results  Component Value Date   CHOL 240 (H) 07/26/2021   HDL 61 07/26/2021   LDLCALC 161 (H) 07/26/2021   TRIG 103 07/26/2021   CHOLHDL 3.9 07/26/2021   Lab Results  Component Value Date   NA 141 07/26/2021   K 3.8 07/26/2021   CREATININE 1.04 (H) 07/26/2021   EGFR 70 07/26/2021   GLUCOSE 87 07/26/2021   TSH 4.080 07/26/2021     The ASCVD Risk score (Arnett DK, et al., 2019) failed to calculate for the following reasons:   The 2019 ASCVD risk score is only valid for ages 62 to 60   ---------------------------------------------------------------------------------------------------   Medications: Outpatient Medications Prior to Visit  Medication Sig   amLODipine  (NORVASC) 5 MG tablet Take 1 tablet (5 mg total) by mouth daily.   hydrochlorothiazide (HYDRODIURIL) 25 MG tablet Take 1 tablet (25 mg total) by mouth daily.   labetalol (NORMODYNE) 200 MG tablet Take 1 tablet (200 mg total) by mouth 2 (two) times daily.   loratadine (CLARITIN) 10 MG tablet Take 1 tablet (10 mg total) by mouth daily.   Multiple Vitamin (MULTIVITAMIN) capsule Take 1 capsule by mouth daily.   No facility-administered medications prior to visit.    Review of Systems  Constitutional:  Negative for appetite change, chills, fatigue and fever.  Respiratory:  Negative for chest tightness and shortness of breath.   Cardiovascular:  Negative for chest pain and palpitations.  Gastrointestinal:  Negative for abdominal pain, nausea and vomiting.  Neurological:  Negative for dizziness and weakness.      Objective    BP 123/79 (BP Location: Right Arm, Patient Position: Sitting, Cuff Size: Large)    Pulse 74    Temp 99 F (37.2 C) (Oral)    Resp 16    Wt 211 lb (95.7 kg)    LMP 08/11/2021 (Exact Date)    SpO2 99% Comment: room air   BMI 39.87 kg/m  {Show previous vital signs (optional):23777}  Physical Exam Vitals reviewed.  Constitutional:      General: She is not in acute distress.    Appearance: Normal appearance. She is well-developed. She is not diaphoretic.  HENT:  Head: Normocephalic and atraumatic.  Eyes:     General: No scleral icterus.    Conjunctiva/sclera: Conjunctivae normal.  Neck:     Thyroid: No thyromegaly.  Cardiovascular:     Rate and Rhythm: Normal rate and regular rhythm.     Pulses: Normal pulses.     Heart sounds: Normal heart sounds. No murmur heard. Pulmonary:     Effort: Pulmonary effort is normal. No respiratory distress.     Breath sounds: Normal breath sounds. No wheezing, rhonchi or rales.  Musculoskeletal:     Cervical back: Neck supple.     Right lower leg: No edema.     Left lower leg: No edema.  Lymphadenopathy:     Cervical: No  cervical adenopathy.  Skin:    General: Skin is warm and dry.     Findings: No rash.  Neurological:     Mental Status: She is alert and oriented to person, place, and time. Mental status is at baseline.  Psychiatric:        Mood and Affect: Mood normal.        Behavior: Behavior normal.      No results found for any visits on 09/05/21.  Assessment & Plan     Problem List Items Addressed This Visit       Cardiovascular and Mediastinum   Hypertension - Primary    Well controlled Continue current medications Reviewed recent metabolic panel F/u in 6 months         Return in about 6 months (around 03/06/2022) for CPE, With new PCP.      I, Lavon Paganini, MD, have reviewed all documentation for this visit. The documentation on 09/05/21 for the exam, diagnosis, procedures, and orders are all accurate and complete.   Renton Berkley, Dionne Bucy, MD, MPH [facility]

## 2021-09-05 ENCOUNTER — Encounter: Payer: Self-pay | Admitting: Family Medicine

## 2021-09-05 ENCOUNTER — Other Ambulatory Visit: Payer: Self-pay

## 2021-09-05 ENCOUNTER — Ambulatory Visit: Payer: 59 | Admitting: Family Medicine

## 2021-09-05 VITALS — BP 123/79 | HR 74 | Temp 99.0°F | Resp 16 | Wt 211.0 lb

## 2021-09-05 DIAGNOSIS — I10 Essential (primary) hypertension: Secondary | ICD-10-CM | POA: Diagnosis not present

## 2021-09-05 NOTE — Assessment & Plan Note (Signed)
Well controlled Continue current medications Reviewed recent metabolic panel F/u in 6 months  

## 2021-09-25 ENCOUNTER — Other Ambulatory Visit: Payer: Self-pay | Admitting: Family Medicine

## 2021-09-25 NOTE — Telephone Encounter (Signed)
Requested Prescriptions  Pending Prescriptions Disp Refills   amLODipine (NORVASC) 5 MG tablet [Pharmacy Med Name: AMLODIPINE BESYLATE 5 MG TAB] 30 tablet 2    Sig: TAKE 1 TABLET BY MOUTH ONCE DAILY     Cardiovascular:  Calcium Channel Blockers Passed - 09/25/2021  1:15 PM      Passed - Last BP in normal range    BP Readings from Last 1 Encounters:  09/05/21 123/79         Passed - Valid encounter within last 6 months    Recent Outpatient Visits          2 weeks ago Primary hypertension   [facility] [place], Marzella Schlein, [place]   2 months ago Primary hypertension   [facility], Marzella Schlein, [place]   1 year ago Essential hypertension   [facility] Trey Sailors, [place]   2 years ago Annual physical exam   [facility] Osvaldo Angst M, [place]   3 years ago Essential hypertension   [facility] [place], Lavella Hammock, [place]      Future Appointments            In 6 months Jacky Kindle, FNP [facility], [facility]

## 2021-12-16 ENCOUNTER — Other Ambulatory Visit: Payer: Self-pay | Admitting: Family Medicine

## 2022-01-08 ENCOUNTER — Other Ambulatory Visit: Payer: Self-pay | Admitting: Family Medicine

## 2022-01-08 DIAGNOSIS — I10 Essential (primary) hypertension: Secondary | ICD-10-CM

## 2022-01-16 ENCOUNTER — Other Ambulatory Visit: Payer: Self-pay | Admitting: Family Medicine

## 2022-01-16 DIAGNOSIS — I10 Essential (primary) hypertension: Secondary | ICD-10-CM

## 2022-03-24 ENCOUNTER — Encounter: Payer: 59 | Admitting: Family Medicine

## 2022-03-25 ENCOUNTER — Ambulatory Visit
Admission: RE | Admit: 2022-03-25 | Discharge: 2022-03-25 | Disposition: A | Discharge status: Home / Self Care | Payer: 59 | Source: Ambulatory Visit | Attending: Obstetrics and Gynecology | Admitting: Obstetrics and Gynecology

## 2022-03-25 DIAGNOSIS — Z01419 Encounter for gynecological examination (general) (routine) without abnormal findings: Secondary | ICD-10-CM

## 2022-03-25 DIAGNOSIS — Z1231 Encounter for screening mammogram for malignant neoplasm of breast: Secondary | ICD-10-CM | POA: Diagnosis present

## 2022-04-29 ENCOUNTER — Telehealth: Payer: Self-pay | Admitting: Family Medicine

## 2022-04-29 DIAGNOSIS — I10 Essential (primary) hypertension: Secondary | ICD-10-CM

## 2022-04-29 NOTE — Telephone Encounter (Signed)
[facility] faxed refill request for the following medications:  labetalol (NORMODYNE) 200 MG tablet   hydrochlorothiazide (HYDRODIURIL) 25 MG tablet   Please advise.

## 2022-04-29 NOTE — Telephone Encounter (Signed)
Called patient to schedule appt. Okay for [facility] to advise and let me know when an appt is schedule so I can refill meds.

## 2022-04-30 NOTE — Telephone Encounter (Signed)
Patient called and advised she will need a CPE as noted below, she agrees. Appointment scheduled for Thursday, 05/08/22 at 1540.

## 2022-05-01 MED ORDER — HYDROCHLOROTHIAZIDE 25 MG PO TABS: 25.0000 mg | ORAL_TABLET | Freq: Every day | ORAL | 0 refills | Status: DC

## 2022-05-01 MED ORDER — LABETALOL HCL 200 MG PO TABS: 200.0000 mg | ORAL_TABLET | Freq: Two times a day (BID) | ORAL | 0 refills | Status: DC

## 2022-05-01 NOTE — Addendum Note (Signed)
Addended by: Marlana Salvage on: 05/01/2022 08:09 AM   Modules accepted: Orders

## 2022-05-01 NOTE — Telephone Encounter (Signed)
HTN meds have been refilled to last until appt.

## 2022-05-07 NOTE — Progress Notes (Unsigned)
Complete physical exam   Patient: Rebecca Freeman   DOB: [birth year]   41 y.o. Female  MRN: [id number] Visit Date: 05/14/2022  Today's healthcare provider: Jacky Kindle, FNP   No chief complaint on file.  Subjective    Rebecca Freeman is a 41 y.o. female who presents today for a complete physical exam.  She reports consuming a {diet types:17450} diet. {Exercise:19826} She generally feels {well/fairly well/poorly:18703}. She reports sleeping {well/fairly well/poorly:18703}. She {does/does not:200015} have additional problems to discuss today.  HPI  ***  Past Medical History:  Diagnosis Date   Anemia    Anemia    Hematuria    Hypertension    Hypertension    PONV (postoperative nausea and vomiting)    Past Surgical History:  Procedure Laterality Date   bilateral clubfoot surgery Bilateral    CESAREAN SECTION     CESAREAN SECTION N/A 01/15/2015   Procedure: REPEAT CESAREAN SECTION AND BILATERAL TUBAL LIGATION ;  Surgeon: Herold Harms, MD;  Location: [facility] ORS;  Service: Obstetrics;  Laterality: N/A;   EYE SURGERY Bilateral 05/2021   LASIK surgery Dr. 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[employer]  Tobacco Use   Smoking status: Never   Smokeless tobacco: Never  Vaping Use   Vaping Use: Never used  Substance and Sexual Activity   Alcohol use: Yes    Alcohol/week: 0.0 standard drinks of alcohol    Comment: occas   Drug use: No   Sexual activity: Yes    Partners: Male    Birth control/protection: Surgical  Other Topics Concern   Not on file  Social History Narrative   Not on file   Social Determinants of Health   Financial Resource Strain: Not on file  Food Insecurity: Not on file  Transportation  Needs: Not on file  Physical Activity: Inactive (03/09/2018)   Exercise Vital Sign    Days of Exercise per Week: 0 days    Minutes of Exercise per Session: 0 min  Stress: Not on file  Social Connections: Not on file  Intimate Partner Violence: Not on file   Family Status  Relation Name Status   Mother  Alive   Father  Alive   Brother  Alive   Son  (Not Specified)   Neg Hx  (Not Specified)   Family History  Problem Relation Age of Onset   Healthy Mother    Healthy Father    Healthy Brother    Club foot Son        BILATERAL   Breast cancer Neg Hx    Ovarian cancer Neg Hx    Colon cancer Neg Hx    Diabetes Neg Hx    Allergies  Allergen Reactions   Morphine And Related Itching and Anxiety    Patient Care Team: Jacky Kindle, FNP as PCP - General (Family Medicine)   Medications: Outpatient Medications Prior to Visit  Medication Sig   amLODipine (NORVASC) 5 MG tablet TAKE 1 TABLET BY MOUTH ONCE DAILY   hydrochlorothiazide (HYDRODIURIL) 25 MG tablet Take 1 tablet (25 mg total) by mouth daily.   labetalol (NORMODYNE) 200 MG tablet Take  1 tablet (200 mg total) by mouth 2 (two) times daily.   loratadine (CLARITIN) 10 MG tablet Take 1 tablet (10 mg total) by mouth daily.   Multiple Vitamin (MULTIVITAMIN) capsule Take 1 capsule by mouth daily.   No facility-administered medications prior to visit.    Review of Systems  {Labs  Heme  Chem  Endocrine  Serology  Results Review (optional):23779}  Objective    There were no vitals taken for this visit. {Show previous vital signs (optional):23777}   Physical Exam  ***  Last depression screening scores    07/25/2021    3:43 PM 06/13/2020    9:03 AM 03/02/2019   10:17 AM  PHQ 2/9 Scores  PHQ - 2 Score 0 0 0  PHQ- 9 Score 2  0   Last fall risk screening    03/02/2019   10:17 AM  Fall Risk   Falls in the past year? 0   Last Audit-C alcohol use screening    07/25/2021    3:42 PM  Alcohol Use Disorder Test  (AUDIT)  1. How often do you have a drink containing alcohol? 2  2. How many drinks containing alcohol do you have on a typical day when you are drinking? 0  3. How often do you have six or more drinks on one occasion? 0  AUDIT-C Score 2   A score of 3 or more in women, and 4 or more in men indicates increased risk for alcohol abuse, EXCEPT if all of the points are from question 1   No results found for any visits on 05/14/22.  Assessment & Plan    Routine Health Maintenance and Physical Exam  Exercise Activities and Dietary recommendations  Goals   None     Immunization History  Administered Date(s) Administered   Influenza Split 08/05/2011, 07/13/2013   Pneumococcal Polysaccharide-23 07/11/2013   Tdap 11/02/2014    Health Maintenance  Topic Date Due   COVID-19 Vaccine (1) Never done   INFLUENZA VACCINE  04/15/2022   TETANUS/TDAP  11/02/2024   PAP SMEAR-Modifier  08/20/2026   Hepatitis C Screening  Completed   HIV Screening  Completed   HPV VACCINES  Aged Out    Discussed health benefits of physical activity, and encouraged her to engage in regular exercise appropriate for her age and condition.  ***  No follow-ups on file.     {provider attestation***:1}   Jacky Kindle, FNP  [facility] [phone] (phone) [phone] (fax)  [facility]

## 2022-05-13 ENCOUNTER — Other Ambulatory Visit: Payer: Self-pay | Admitting: Family Medicine

## 2022-05-13 DIAGNOSIS — J309 Allergic rhinitis, unspecified: Secondary | ICD-10-CM

## 2022-05-14 ENCOUNTER — Encounter: Payer: Self-pay | Admitting: Family Medicine

## 2022-05-14 ENCOUNTER — Ambulatory Visit (INDEPENDENT_AMBULATORY_CARE_PROVIDER_SITE_OTHER): Payer: 59 | Admitting: Family Medicine

## 2022-05-14 ENCOUNTER — Other Ambulatory Visit: Payer: Self-pay | Admitting: Family Medicine

## 2022-05-14 VITALS — BP 143/85 | HR 71 | Temp 97.8°F | Resp 16 | Ht 61.0 in | Wt 221.0 lb

## 2022-05-14 DIAGNOSIS — J309 Allergic rhinitis, unspecified: Secondary | ICD-10-CM

## 2022-05-14 DIAGNOSIS — Z0000 Encounter for general adult medical examination without abnormal findings: Secondary | ICD-10-CM | POA: Diagnosis not present

## 2022-05-14 DIAGNOSIS — I10 Essential (primary) hypertension: Secondary | ICD-10-CM

## 2022-05-14 MED ORDER — AMLODIPINE BESYLATE 5 MG PO TABS
5.0000 mg | ORAL_TABLET | Freq: Every day | ORAL | 3 refills | Status: DC
Start: 2022-05-14 — End: 2023-03-13

## 2022-05-14 MED ORDER — LORATADINE 10 MG PO TABS: 10.0000 mg | ORAL_TABLET | Freq: Every day | ORAL | 3 refills | Status: DC

## 2022-05-14 MED ORDER — LABETALOL HCL 200 MG PO TABS: 200.0000 mg | ORAL_TABLET | Freq: Two times a day (BID) | ORAL | 3 refills | Status: DC

## 2022-05-14 MED ORDER — HYDROCHLOROTHIAZIDE 25 MG PO TABS: 25.0000 mg | ORAL_TABLET | Freq: Every day | ORAL | 3 refills | Status: DC

## 2022-05-14 NOTE — Telephone Encounter (Signed)
Spoke with patient and patient's husband. Her last two visits were for HTN, she has not had a physical in over 3 years.

## 2022-05-14 NOTE — Assessment & Plan Note (Signed)
UTD on dental and vision Things to do to keep yourself healthy  - Exercise at least 30-45 minutes a day, 3-4 days a week.  - Eat a low-fat diet with lots of fruits and vegetables, up to 7-9 servings per day.  - Seatbelts can save your life. Wear them always.  - Smoke detectors on every level of your home, check batteries every year.  - Eye Doctor - have an eye exam every 1-2 years  - Safe sex - if you may be exposed to STDs, use a condom.  - Alcohol -  If you drink, do it moderately, less than 2 drinks per day.  - Health Care Power of Attorney. Choose someone to speak for you if you are not able.  - Depression is common in our stressful world.If you're feeling down or losing interest in things you normally enjoy, please come in for a visit.  - Violence - If anyone is threatening or hurting you, please call immediately.  

## 2022-05-14 NOTE — Telephone Encounter (Signed)
Pt called in w/ spouse to inquire why she is required to have another physical for a medication refill when her last cpe was on 03-24-2022  Pts spouse states pt will have to pay out of pocket if she has 2 physicals done within a year  Pts next appt is today, 8-30  Please advise and  fu w/ pt

## 2022-05-14 NOTE — Assessment & Plan Note (Signed)
Chronic, previously stable, elevated today Encourage home checks; reach back out in 4 weeks with checks; will increase Norvasc to 10 mg from current 5 mg  Denies CP Denies SOB/ DOE Denies low blood pressure/hypotension Denies vision changes No LE Edema noted on exam Continue medication, Norvasc 5 mg, Labetalol 200 mg, HCTZ 25 mg Denies side effects Seek emergent care if you develop chest pain or chest pressure

## 2022-05-14 NOTE — Assessment & Plan Note (Signed)
Chronic, stable °Pulled for refill °

## 2022-05-15 LAB — CBC WITH DIFFERENTIAL/PLATELET
Basophils Absolute: 0.1 10*3/uL (ref 0.0–0.2)
Basos: 1 %
EOS (ABSOLUTE): 0.3 10*3/uL (ref 0.0–0.4)
Eos: 4 %
Hematocrit: 42.4 % (ref 34.0–46.6)
Hemoglobin: 14.2 g/dL (ref 11.1–15.9)
Immature Grans (Abs): 0 10*3/uL (ref 0.0–0.1)
Immature Granulocytes: 0 %
Lymphocytes Absolute: 1.9 10*3/uL (ref 0.7–3.1)
Lymphs: 27 %
MCH: 30.6 pg (ref 26.6–33.0)
MCHC: 33.5 g/dL (ref 31.5–35.7)
MCV: 91 fL (ref 79–97)
Monocytes Absolute: 0.4 10*3/uL (ref 0.1–0.9)
Monocytes: 6 %
Neutrophils Absolute: 4.4 10*3/uL (ref 1.4–7.0)
Neutrophils: 62 %
Platelets: 248 10*3/uL (ref 150–450)
RBC: 4.64 x10E6/uL (ref 3.77–5.28)
RDW: 12.5 % (ref 11.7–15.4)
WBC: 7.1 10*3/uL (ref 3.4–10.8)

## 2022-05-15 LAB — COMPREHENSIVE METABOLIC PANEL
ALT: 19 IU/L (ref 0–32)
AST: 15 IU/L (ref 0–40)
Albumin/Globulin Ratio: 2.2 (ref 1.2–2.2)
Albumin: 4.4 g/dL (ref 3.9–4.9)
Alkaline Phosphatase: 47 IU/L (ref 44–121)
BUN/Creatinine Ratio: 12 (ref 9–23)
BUN: 12 mg/dL (ref 6–24)
Bilirubin Total: 0.2 mg/dL (ref 0.0–1.2)
CO2: 24 mmol/L (ref 20–29)
Calcium: 9.4 mg/dL (ref 8.7–10.2)
Chloride: 103 mmol/L (ref 96–106)
Creatinine, Ser: 1.02 mg/dL — ABNORMAL HIGH (ref 0.57–1.00)
Globulin, Total: 2 g/dL (ref 1.5–4.5)
Glucose: 93 mg/dL (ref 70–99)
Potassium: 4.4 mmol/L (ref 3.5–5.2)
Sodium: 140 mmol/L (ref 134–144)
Total Protein: 6.4 g/dL (ref 6.0–8.5)
eGFR: 71 mL/min/{1.73_m2} (ref >59)

## 2022-05-15 LAB — TSH+FREE T4
Free T4: 1.35 ng/dL (ref 0.82–1.77)
TSH: 6.01 u[IU]/mL — ABNORMAL HIGH (ref 0.450–4.500)

## 2022-05-15 LAB — LIPID PANEL
Chol/HDL Ratio: 3.8 ratio (ref 0.0–4.4)
Cholesterol, Total: 203 mg/dL — ABNORMAL HIGH (ref 100–199)
HDL: 53 mg/dL (ref >39)
LDL Chol Calc (NIH): 127 mg/dL — ABNORMAL HIGH (ref 0–99)
Triglycerides: 128 mg/dL (ref 0–149)
VLDL Cholesterol Cal: 23 mg/dL (ref 5–40)

## 2022-05-16 NOTE — Progress Notes (Signed)
Thyroid remains overstimulated; TSH at 6, previously at 4. Recommend start of medication given reoccurrence within the year.  Stable blood chemistry- slight elevation of creatinine remains.  Cholesterol is much improved. The 10-year ASCVD risk score (Arnett DK, et al., 2019) is: 1.1%   Values used to calculate the score:     Age: 41 years     Sex: Female     Is Non-Hispanic African American: No     Diabetic: No     Tobacco smoker: No     Systolic Blood Pressure: 143 mmHg     Is BP treated: Yes     HDL Cholesterol: 53 mg/dL     Total Cholesterol: 203 mg/dL Heart attack and stroke risk is 1% estimated within the next 10 years which is low.   Stable cell count.  Jacky Kindle, FNP  [facility] [address] [place], [place] [address] [phone] (phone) [phone] (fax) [facility]

## 2022-05-20 ENCOUNTER — Encounter: Payer: Self-pay | Admitting: Family Medicine

## 2022-05-20 DIAGNOSIS — E038 Other specified hypothyroidism: Secondary | ICD-10-CM

## 2022-05-21 ENCOUNTER — Other Ambulatory Visit: Payer: Self-pay | Admitting: Family Medicine

## 2022-06-04 MED ORDER — LEVOTHYROXINE SODIUM 50 MCG PO TABS: 50.0000 ug | ORAL_TABLET | Freq: Every day | ORAL | 0 refills | Status: DC

## 2022-08-14 ENCOUNTER — Other Ambulatory Visit: Payer: Self-pay | Admitting: Family Medicine

## 2022-08-14 DIAGNOSIS — E038 Other specified hypothyroidism: Secondary | ICD-10-CM

## 2022-08-14 MED ORDER — LEVOTHYROXINE SODIUM 50 MCG PO TABS: 50.0000 ug | ORAL_TABLET | Freq: Every day | ORAL | 0 refills | Status: DC

## 2022-08-14 NOTE — Addendum Note (Signed)
Addended by: Wilford Corner on: 08/14/2022 01:30 PM   Modules accepted: Orders

## 2022-08-14 NOTE — Telephone Encounter (Signed)
Patient called, left VM to return the call to the office regarding levothyroxine refill. When patient returns the call, let her know that it was noted in MyChart message 06/04/22 to return in 6-8 weeks for labs to check the thyroid. Will refill x 30 days due to labs.  Jacky Kindle, FNP  to Rebecca Freeman     06/04/22  2:00 PM Thanks for your patience; I was out of the office last week.   I have written the Rx; I would recommend labs in 6-8 weeks following start.   Requested Prescriptions  Pending Prescriptions Disp Refills   levothyroxine (SYNTHROID) 50 MCG tablet [Pharmacy Med Name: LEVOTHYROXINE SODIUM 50 MCG TAB] 90 tablet 0    Sig: TAKE 1 TABLET BY MOUTH ONCE DAILY ON AN EMPTY STOMACH. WAIT 30 MINUTES BEFORE TAKING OTHER MEDS.     Endocrinology:  Hypothyroid Agents Failed - 08/14/2022 12:54 PM      Failed - TSH in normal range and within 360 days    TSH  Date Value Ref Range Status  05/14/2022 6.010 (H) 0.450 - 4.500 uIU/mL Final         Passed - Valid encounter within last 12 months    Recent Outpatient Visits           3 months ago Annual physical exam   [facility] Jacky Kindle, FNP   11 months ago Primary hypertension   [facility] [place], Marzella Schlein, [place]   1 year ago Primary hypertension   [facility] [place], Marzella Schlein, [place]   2 years ago Essential hypertension   [facility] Trey Sailors, [place]   3 years ago Annual physical exam   [place] Osvaldo Angst M, [place]

## 2023-02-20 ENCOUNTER — Other Ambulatory Visit: Payer: Self-pay | Admitting: Family Medicine

## 2023-02-20 DIAGNOSIS — J309 Allergic rhinitis, unspecified: Secondary | ICD-10-CM

## 2023-02-20 NOTE — Telephone Encounter (Signed)
Unable to refill per protocol, Rx request is too soon. Last refill 05/14/22 for 90 and 3 refills.  Requested Prescriptions  Pending Prescriptions Disp Refills   loratadine (CLARITIN) 10 MG tablet [Pharmacy Med Name: LORATADINE 10 MG TAB] 90 tablet 3    Sig: TAKE 1 TABLET BY MOUTH ONCE DAILY     Ear, Nose, and Throat:  Antihistamines 2 Failed - 02/20/2023 10:30 AM      Failed - Cr in normal range and within 360 days    Creatinine, Ser  Date Value Ref Range Status  05/14/2022 1.02 (H) 0.57 - 1.00 mg/dL Final         Passed - Valid encounter within last 12 months    Recent Outpatient Visits           9 months ago Annual physical exam   [facility] Jacky Kindle, FNP   1 year ago Primary hypertension   [facility] [place], Marzella Schlein, [place]   1 year ago Primary hypertension   [facility] [place], Marzella Schlein, [place]   2 years ago Essential hypertension   [facility] Trey Sailors, [place]   3 years ago Annual physical exam   [facility] [place], Ricki Rodriguez [place], [place]

## 2023-03-12 ENCOUNTER — Other Ambulatory Visit: Payer: Self-pay | Admitting: Family Medicine

## 2023-03-12 DIAGNOSIS — I10 Essential (primary) hypertension: Secondary | ICD-10-CM

## 2023-04-01 ENCOUNTER — Other Ambulatory Visit: Payer: Self-pay | Admitting: Family Medicine

## 2023-04-01 DIAGNOSIS — I10 Essential (primary) hypertension: Secondary | ICD-10-CM

## 2023-05-06 ENCOUNTER — Other Ambulatory Visit: Payer: Self-pay | Admitting: Family Medicine

## 2023-05-06 DIAGNOSIS — I10 Essential (primary) hypertension: Secondary | ICD-10-CM

## 2023-05-07 NOTE — Telephone Encounter (Signed)
Requested medication (s) are due for refill today: yes  Requested medication (s) are on the active medication list: yes  Last refill:  04/01/23  Future visit scheduled: no  Notes to clinic:  Unable to refill per protocol, courtesy refill already given, routing for provider approval.      Requested Prescriptions  Pending Prescriptions Disp Refills   hydrochlorothiazide (HYDRODIURIL) 25 MG tablet [Pharmacy Med Name: hydroCHLOROthiazide 25 MG Oral Tablet] 30 tablet 11    Sig: TAKE 1 TABLET BY MOUTH DAILY     Cardiovascular: Diuretics - Thiazide Failed - 05/06/2023 10:12 PM      Failed - Cr in normal range and within 180 days    Creatinine, Ser  Date Value Ref Range Status  05/14/2022 1.02 (H) 0.57 - 1.00 mg/dL Final         Failed - K in normal range and within 180 days    Potassium  Date Value Ref Range Status  05/14/2022 4.4 3.5 - 5.2 mmol/L Final         Failed - Na in normal range and within 180 days    Sodium  Date Value Ref Range Status  05/14/2022 140 134 - 144 mmol/L Final         Failed - Last BP in normal range    BP Readings from Last 1 Encounters:  05/14/22 (!) 143/85         Failed - Valid encounter within last 6 months    Recent Outpatient Visits           11 months ago Annual physical exam   [facility] Jacky Kindle, FNP   1 year ago Primary hypertension   [facility] [place], Marzella Schlein, [place]   1 year ago Primary hypertension   [facility] [place], Marzella Schlein, [place]   2 years ago Essential hypertension   [facility] Trey Sailors, [place]   4 years ago Annual physical exam   [facility] [place], Ricki Rodriguez [place], [place]

## 2023-05-08 ENCOUNTER — Other Ambulatory Visit: Payer: Self-pay | Admitting: Family Medicine

## 2023-05-08 DIAGNOSIS — I10 Essential (primary) hypertension: Secondary | ICD-10-CM

## 2023-05-08 MED ORDER — HYDROCHLOROTHIAZIDE 25 MG PO TABS
25.0000 mg | ORAL_TABLET | Freq: Every day | ORAL | 0 refills | Status: DC
Start: 2023-05-08 — End: 2023-06-05

## 2023-05-08 NOTE — Telephone Encounter (Signed)
Message was sent via my chart to patient to call and schedule appointment

## 2023-05-21 ENCOUNTER — Encounter: Payer: 59 | Admitting: Family Medicine

## 2023-05-26 ENCOUNTER — Other Ambulatory Visit: Payer: Self-pay | Admitting: Family Medicine

## 2023-05-26 DIAGNOSIS — I10 Essential (primary) hypertension: Secondary | ICD-10-CM

## 2023-06-05 ENCOUNTER — Encounter: Payer: Self-pay | Admitting: Family Medicine

## 2023-06-05 ENCOUNTER — Ambulatory Visit (INDEPENDENT_AMBULATORY_CARE_PROVIDER_SITE_OTHER): Payer: 59 | Admitting: Family Medicine

## 2023-06-05 VITALS — BP 125/85 | HR 67 | Ht 61.0 in | Wt 211.9 lb

## 2023-06-05 DIAGNOSIS — R7989 Other specified abnormal findings of blood chemistry: Secondary | ICD-10-CM | POA: Insufficient documentation

## 2023-06-05 DIAGNOSIS — Z0000 Encounter for general adult medical examination without abnormal findings: Secondary | ICD-10-CM

## 2023-06-05 DIAGNOSIS — J309 Allergic rhinitis, unspecified: Secondary | ICD-10-CM

## 2023-06-05 DIAGNOSIS — E782 Mixed hyperlipidemia: Secondary | ICD-10-CM | POA: Diagnosis not present

## 2023-06-05 DIAGNOSIS — E559 Vitamin D deficiency, unspecified: Secondary | ICD-10-CM

## 2023-06-05 DIAGNOSIS — E038 Other specified hypothyroidism: Secondary | ICD-10-CM | POA: Diagnosis not present

## 2023-06-05 DIAGNOSIS — I10 Essential (primary) hypertension: Secondary | ICD-10-CM

## 2023-06-05 MED ORDER — LORATADINE 10 MG PO TABS
10.0000 mg | ORAL_TABLET | Freq: Every day | ORAL | 3 refills | Status: DC
Start: 2023-06-05 — End: 2023-09-15

## 2023-06-05 MED ORDER — HYDROCHLOROTHIAZIDE 25 MG PO TABS
25.0000 mg | ORAL_TABLET | Freq: Every day | ORAL | 3 refills | Status: DC
Start: 2023-06-05 — End: 2024-04-06

## 2023-06-05 MED ORDER — LABETALOL HCL 200 MG PO TABS
200.0000 mg | ORAL_TABLET | Freq: Two times a day (BID) | ORAL | 3 refills | Status: DC
Start: 2023-06-05 — End: 2024-04-15

## 2023-06-05 NOTE — Assessment & Plan Note (Signed)
Chronic, repeat LP The 10-year ASCVD risk score (Arnett DK, et al., 2019) is: 0.9%

## 2023-06-05 NOTE — Assessment & Plan Note (Signed)
Chronic, Body mass index is 40.04 kg/m. Discussed importance of healthy weight management Discussed diet and exercise

## 2023-06-05 NOTE — Assessment & Plan Note (Signed)
No concerns Things to do to keep yourself healthy  - Exercise at least 30-45 minutes a day, 3-4 days a week.  - Eat a low-fat diet with lots of fruits and vegetables, up to 7-9 servings per day.  - Seatbelts can save your life. Wear them always.  - Smoke detectors on every level of your home, check batteries every year.  - Eye Doctor - have an eye exam every 1-2 years  - Safe sex - if you may be exposed to STDs, use a condom.  - Alcohol -  If you drink, do it moderately, less than 2 drinks per day.  - Health Care Power of Attorney. Choose someone to speak for you if you are not able.  - Depression is common in our stressful world.If you're feeling down or losing interest in things you normally enjoy, please come in for a visit.  - Violence - If anyone is threatening or hurting you, please call immediately.

## 2023-06-05 NOTE — Assessment & Plan Note (Signed)
Chronic, repeat labs Not currently on supplement

## 2023-06-05 NOTE — Assessment & Plan Note (Signed)
historically Repeat CMP

## 2023-06-05 NOTE — Assessment & Plan Note (Signed)
Chronic, repeat labs Not on daily synthroid to assist; does not wish to start if optional

## 2023-06-05 NOTE — Assessment & Plan Note (Signed)
Chronic, stable Continue medications as previously prescribed

## 2023-06-05 NOTE — Progress Notes (Signed)
Complete physical exam  Patient: Rebecca Freeman   DOB: [birth year]   42 y.o. Female  MRN: [id number] Visit Date: 06/05/2023  Today's healthcare provider: Jacky Kindle, FNP  Re-introduced to nurse practitioner role and practice setting.  All questions answered.  Discussed provider/patient relationship and expectations.  Chief Complaint  Patient presents with   Annual Exam   Medication Refill    All medications except levothyroxine    Subjective    Rebecca Freeman is a 42 y.o. female who presents today for a complete physical exam.  She reports consuming a general diet. The patient does not participate in regular exercise at present. She generally feels fairly well. She reports sleeping fairly well. She does have additional problems to discuss today.   Medication Refill   HPI     Medication Refill    Additional comments: All medications except levothyroxine       Last edited by Rolly Salter, CMA on 06/05/2023  1:55 PM.       Past Medical History:  Diagnosis Date   Anemia    Anemia    Hematuria    Hypertension    Hypertension    PONV (postoperative nausea and vomiting)    Past Surgical History:  Procedure Laterality Date   bilateral clubfoot surgery Bilateral    CESAREAN SECTION     CESAREAN SECTION N/A 01/15/2015   Procedure: REPEAT CESAREAN SECTION AND BILATERAL TUBAL LIGATION ;  Surgeon: Herold Harms, MD;  Location: [facility] ORS;  Service: Obstetrics;  Laterality: N/A;   EYE SURGERY Bilateral 05/2021   LASIK surgery Dr. 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[employer]  Tobacco Use   Smoking status: Never   Smokeless tobacco: Never  Vaping Use   Vaping status: Never Used  Substance and Sexual  Activity   Alcohol use: Yes    Alcohol/week: 0.0 standard drinks of alcohol    Comment: occas   Drug use: No   Sexual activity: Yes    Partners: Male    Birth control/protection: Surgical  Other Topics Concern   Not on file  Social History Narrative   Not on file   Social Determinants of Health   Financial Resource Strain: Not on file  Food Insecurity: Not on file  Transportation Needs: Not on file  Physical Activity: Inactive (03/09/2018)   Exercise Vital Sign    Days of Exercise per Week: 0 days    Minutes of Exercise per Session: 0 min  Stress: Not on file  Social Connections: Not on file  Intimate Partner Violence: Not on file   Family Status  Relation Name Status   Mother  Alive   Father  Alive   Brother  Alive   Son  (Not Specified)   Neg Hx  (Not Specified)  No partnership data on file   Family History  Problem Relation Age of Onset   Healthy Mother    Healthy Father    Healthy Brother    Club foot Son        BILATERAL   Breast cancer Neg Hx    Ovarian cancer Neg Hx    Colon  cancer Neg Hx    Diabetes Neg Hx    Allergies  Allergen Reactions   Morphine And Codeine Itching and Anxiety    Patient Care Team: Jacky Kindle, FNP as PCP - General (Family Medicine)   Medications: Outpatient Medications Prior to Visit  Medication Sig   amLODipine (NORVASC) 5 MG tablet TAKE 1 TABLET BY MOUTH DAILY  PLEASE SCHEDULE OFFICE VISIT  BEFORE ANY FUTURE REFILL   Multiple Vitamin (MULTIVITAMIN) capsule Take 1 capsule by mouth daily.   [DISCONTINUED] hydrochlorothiazide (HYDRODIURIL) 25 MG tablet Take 1 tablet (25 mg total) by mouth daily.   [DISCONTINUED] labetalol (NORMODYNE) 200 MG tablet TAKE 1 TABLET BY MOUTH TWICE  DAILY PLEASE SCHEDULE OFFICE  VISIT BEFORE ANY FUTURE REFILL   [DISCONTINUED] levothyroxine (SYNTHROID) 50 MCG tablet Take 1 tablet (50 mcg total) by mouth daily before breakfast. Take in the morning with water prior to any other  medications/supplements. LABS NEEDED BEFORE ADDITIONAL REFILLS-CALL OFFICE (Patient not taking: Reported on 06/05/2023)   [DISCONTINUED] loratadine (CLARITIN) 10 MG tablet Take 1 tablet (10 mg total) by mouth daily.   No facility-administered medications prior to visit.    Objective    BP 125/85 (BP Location: Right Arm, Patient Position: Sitting, Cuff Size: Normal)   Pulse 67   Ht 5\' 1"  (1.549 m)   Wt 211 lb 14.4 oz (96.1 kg)   BMI 40.04 kg/m   Physical Exam Vitals and nursing note reviewed.  Constitutional:      General: She is awake. She is not in acute distress.    Appearance: Normal appearance. She is well-developed and well-groomed. She is obese. She is not ill-appearing, toxic-appearing or diaphoretic.  HENT:     Head: Normocephalic and atraumatic.     Jaw: There is normal jaw occlusion. No trismus, tenderness, swelling or pain on movement.     Right Ear: Hearing, tympanic membrane, ear canal and external ear normal. There is no impacted cerumen.     Left Ear: Hearing, tympanic membrane, ear canal and external ear normal. There is no impacted cerumen.     Nose: Nose normal. No congestion or rhinorrhea.     Right Turbinates: Not enlarged, swollen or pale.     Left Turbinates: Not enlarged, swollen or pale.     Right Sinus: No maxillary sinus tenderness or frontal sinus tenderness.     Left Sinus: No maxillary sinus tenderness or frontal sinus tenderness.     Mouth/Throat:     Lips: Pink.     Mouth: Mucous membranes are moist. No injury.     Tongue: No lesions.     Pharynx: Oropharynx is clear. Uvula midline. No pharyngeal swelling, oropharyngeal exudate, posterior oropharyngeal erythema or uvula swelling.     Tonsils: No tonsillar exudate or tonsillar abscesses.  Eyes:     General: Lids are normal. Lids are everted, no foreign bodies appreciated. Vision grossly intact. Gaze aligned appropriately. No allergic shiner or visual field deficit.       Right eye: No discharge.         Left eye: No discharge.     Extraocular Movements: Extraocular movements intact.     Conjunctiva/sclera: Conjunctivae normal.     Right eye: Right conjunctiva is not injected. No exudate.    Left eye: Left conjunctiva is not injected. No exudate.    Pupils: Pupils are equal, round, and reactive to light.  Neck:     Thyroid: No thyroid mass, thyromegaly or thyroid tenderness.  Vascular: No carotid bruit.     Trachea: Trachea normal.  Cardiovascular:     Rate and Rhythm: Normal rate and regular rhythm.     Pulses: Normal pulses.          Carotid pulses are 2+ on the right side and 2+ on the left side.      Radial pulses are 2+ on the right side and 2+ on the left side.       Dorsalis pedis pulses are 2+ on the right side and 2+ on the left side.       Posterior tibial pulses are 2+ on the right side and 2+ on the left side.     Heart sounds: Normal heart sounds, S1 normal and S2 normal. No murmur heard.    No friction rub. No gallop.  Pulmonary:     Effort: Pulmonary effort is normal. No respiratory distress.     Breath sounds: Normal breath sounds and air entry. No stridor. No wheezing, rhonchi or rales.  Chest:     Chest wall: No tenderness.  Abdominal:     General: Abdomen is flat. Bowel sounds are normal. There is no distension.     Palpations: Abdomen is soft. There is no mass.     Tenderness: There is no abdominal tenderness. There is no right CVA tenderness, left CVA tenderness, guarding or rebound.     Hernia: No hernia is present.  Genitourinary:    Comments: Exam deferred; denies complaints Musculoskeletal:        General: No swelling, tenderness, deformity or signs of injury. Normal range of motion.     Cervical back: Full passive range of motion without pain, normal range of motion and neck supple. No edema, rigidity or tenderness. No muscular tenderness.     Right lower leg: No edema.     Left lower leg: No edema.  Lymphadenopathy:     Cervical: No cervical  adenopathy.     Right cervical: No superficial, deep or posterior cervical adenopathy.    Left cervical: No superficial, deep or posterior cervical adenopathy.  Skin:    General: Skin is warm and dry.     Capillary Refill: Capillary refill takes less than 2 seconds.     Coloration: Skin is not jaundiced or pale.     Findings: No bruising, erythema, lesion or rash.  Neurological:     General: No focal deficit present.     Mental Status: She is alert and oriented to person, place, and time. Mental status is at baseline.     GCS: GCS eye subscore is 4. GCS verbal subscore is 5. GCS motor subscore is 6.     Sensory: Sensation is intact. No sensory deficit.     Motor: Motor function is intact. No weakness.     Coordination: Coordination is intact. Coordination normal.     Gait: Gait is intact. Gait normal.  Psychiatric:        Attention and Perception: Attention and perception normal.        Mood and Affect: Mood and affect normal.        Speech: Speech normal.        Behavior: Behavior normal. Behavior is cooperative.        Thought Content: Thought content normal.        Cognition and Memory: Cognition and memory normal.        Judgment: Judgment normal.     Last depression screening scores    06/05/2023  2:01 PM 05/14/2022    3:44 PM 07/25/2021    3:43 PM  PHQ 2/9 Scores  PHQ - 2 Score 0 0 0  PHQ- 9 Score  0 2   Last fall risk screening    05/14/2022    3:44 PM  Fall Risk   Falls in the past year? 0  Number falls in past yr: 0  Injury with Fall? 0  Risk for fall due to : No Fall Risks  Follow up Falls evaluation completed   Last Audit-C alcohol use screening    05/14/2022    3:45 PM  Alcohol Use Disorder Test (AUDIT)  1. How often do you have a drink containing alcohol? 1  2. How many drinks containing alcohol do you have on a typical day when you are drinking? 0  3. How often do you have six or more drinks on one occasion? 0  AUDIT-C Score 1   A score of 3 or  more in women, and 4 or more in men indicates increased risk for alcohol abuse, EXCEPT if all of the points are from question 1   No results found for any visits on 06/05/23.  Assessment & Plan    Routine Health Maintenance and Physical Exam  Exercise Activities and Dietary recommendations  Goals   None     Immunization History  Administered Date(s) Administered   Influenza Split 08/05/2011, 07/13/2013   Pneumococcal Polysaccharide-23 07/11/2013   Tdap 11/02/2014    Health Maintenance  Topic Date Due   COVID-19 Vaccine (1 - 2023-24 season) Never done   INFLUENZA VACCINE  12/14/2023 (Originally 04/16/2023)   DTaP/Tdap/Td (2 - Td or Tdap) 11/02/2024   Cervical Cancer Screening (HPV/Pap Cotest)  08/20/2026   Hepatitis C Screening  Completed   HIV Screening  Completed   HPV VACCINES  Aged Out    Discussed health benefits of physical activity, and encouraged her to engage in regular exercise appropriate for her age and condition.  Problem List Items Addressed This Visit       Cardiovascular and Mediastinum   Chronic hypertension    Chronic, at goal Continue medications/refills provided Repeat CBC, CMP, TSH       Relevant Medications   hydrochlorothiazide (HYDRODIURIL) 25 MG tablet   labetalol (NORMODYNE) 200 MG tablet   Other Relevant Orders   CBC with Differential/Platelet   Comprehensive metabolic panel     Respiratory   Allergic rhinitis    Chronic, stable Continue medications as previously prescribed      Relevant Medications   loratadine (CLARITIN) 10 MG tablet     Endocrine   Subclinical hypothyroidism    Chronic, repeat labs Not on daily synthroid to assist; does not wish to start if optional       Relevant Medications   labetalol (NORMODYNE) 200 MG tablet     Other   Annual physical exam - Primary    No concerns Things to do to keep yourself healthy  - Exercise at least 30-45 minutes a day, 3-4 days a week.  - Eat a low-fat diet with lots of  fruits and vegetables, up to 7-9 servings per day.  - Seatbelts can save your life. Wear them always.  - Smoke detectors on every level of your home, check batteries every year.  - Eye Doctor - have an eye exam every 1-2 years  - Safe sex - if you may be exposed to STDs, use a condom.  - Alcohol -  If you drink,  do it moderately, less than 2 drinks per day.  - Health Care Power of Attorney. Choose someone to speak for you if you are not able.  - Depression is common in our stressful world.If you're feeling down or losing interest in things you normally enjoy, please come in for a visit.  - Violence - If anyone is threatening or hurting you, please call immediately.       Avitaminosis D    Chronic, repeat labs Not currently on supplement      Elevated serum creatinine    historically Repeat CMP      Moderate mixed hyperlipidemia not requiring statin therapy    Chronic, repeat LP The 10-year ASCVD risk score (Arnett DK, et al., 2019) is: 0.9%       Relevant Medications   hydrochlorothiazide (HYDRODIURIL) 25 MG tablet   labetalol (NORMODYNE) 200 MG tablet   Other Relevant Orders   Lipid Panel With LDL/HDL Ratio   Morbid obesity (HCC)    Chronic, Body mass index is 40.04 kg/m. Discussed importance of healthy weight management Discussed diet and exercise       Relevant Orders   Hemoglobin A1c   Vitamin D (25 hydroxy)   TSH   Return in about 1 year (around 06/04/2024), or if symptoms worsen or fail to improve, for annual examination.    Leilani Merl, FNP, have reviewed all documentation for this visit. The documentation on 06/05/23 for the exam, diagnosis, procedures, and orders are all accurate and complete.  Jacky Kindle, FNP  [facility] [phone] (phone) [phone] (fax)  [facility]

## 2023-06-05 NOTE — Assessment & Plan Note (Signed)
Chronic, at goal Continue medications/refills provided Repeat CBC, CMP, TSH

## 2023-06-06 LAB — CBC WITH DIFFERENTIAL/PLATELET
Basophils Absolute: 0 10*3/uL (ref 0.0–0.2)
Basos: 1 %
EOS (ABSOLUTE): 0.1 10*3/uL (ref 0.0–0.4)
Eos: 2 %
Hematocrit: 44.3 % (ref 34.0–46.6)
Hemoglobin: 14.7 g/dL (ref 11.1–15.9)
Immature Grans (Abs): 0 10*3/uL (ref 0.0–0.1)
Immature Granulocytes: 0 %
Lymphocytes Absolute: 1.9 10*3/uL (ref 0.7–3.1)
Lymphs: 27 %
MCH: 31.4 pg (ref 26.6–33.0)
MCHC: 33.2 g/dL (ref 31.5–35.7)
MCV: 95 fL (ref 79–97)
Monocytes Absolute: 0.4 10*3/uL (ref 0.1–0.9)
Monocytes: 6 %
Neutrophils Absolute: 4.6 10*3/uL (ref 1.4–7.0)
Neutrophils: 64 %
Platelets: 263 10*3/uL (ref 150–450)
RBC: 4.68 x10E6/uL (ref 3.77–5.28)
RDW: 11.9 % (ref 11.7–15.4)
WBC: 7.1 10*3/uL (ref 3.4–10.8)

## 2023-06-06 LAB — LIPID PANEL WITH LDL/HDL RATIO
Cholesterol, Total: 219 mg/dL — ABNORMAL HIGH (ref 100–199)
HDL: 59 mg/dL (ref >39)
LDL Chol Calc (NIH): 141 mg/dL — ABNORMAL HIGH (ref 0–99)
LDL/HDL Ratio: 2.4 ratio (ref 0.0–3.2)
Triglycerides: 108 mg/dL (ref 0–149)
VLDL Cholesterol Cal: 19 mg/dL (ref 5–40)

## 2023-06-06 LAB — COMPREHENSIVE METABOLIC PANEL
ALT: 15 IU/L (ref 0–32)
AST: 13 IU/L (ref 0–40)
Albumin: 4.4 g/dL (ref 3.9–4.9)
Alkaline Phosphatase: 53 IU/L (ref 44–121)
BUN/Creatinine Ratio: 10 (ref 9–23)
BUN: 11 mg/dL (ref 6–24)
Bilirubin Total: 0.4 mg/dL (ref 0.0–1.2)
CO2: 25 mmol/L (ref 20–29)
Calcium: 9.8 mg/dL (ref 8.7–10.2)
Chloride: 99 mmol/L (ref 96–106)
Creatinine, Ser: 1.08 mg/dL — ABNORMAL HIGH (ref 0.57–1.00)
Globulin, Total: 2.3 g/dL (ref 1.5–4.5)
Glucose: 95 mg/dL (ref 70–99)
Potassium: 4 mmol/L (ref 3.5–5.2)
Sodium: 139 mmol/L (ref 134–144)
Total Protein: 6.7 g/dL (ref 6.0–8.5)
eGFR: 66 mL/min/{1.73_m2} (ref >59)

## 2023-06-06 LAB — HEMOGLOBIN A1C
Est. average glucose Bld gHb Est-mCnc: 103 mg/dL
Hgb A1c MFr Bld: 5.2 % (ref 4.8–5.6)

## 2023-06-06 LAB — VITAMIN D 25 HYDROXY (VIT D DEFICIENCY, FRACTURES): Vit D, 25-Hydroxy: 41.1 ng/mL (ref 30.0–100.0)

## 2023-06-06 LAB — TSH: TSH: 5.19 u[IU]/mL — ABNORMAL HIGH (ref 0.450–4.500)

## 2023-09-12 ENCOUNTER — Other Ambulatory Visit: Payer: Self-pay | Admitting: Family Medicine

## 2023-09-12 DIAGNOSIS — J309 Allergic rhinitis, unspecified: Secondary | ICD-10-CM

## 2024-04-06 ENCOUNTER — Telehealth: Payer: Self-pay | Admitting: Family Medicine

## 2024-04-06 DIAGNOSIS — I10 Essential (primary) hypertension: Secondary | ICD-10-CM

## 2024-04-06 NOTE — Telephone Encounter (Signed)
 [facility] is requesting refills on Hydochlorothiazide  25 mg. #90 with 3 RF

## 2024-04-06 NOTE — Addendum Note (Signed)
 Addended by: LILIAN SEVERO RAMAN on: 04/06/2024 11:05 AM   Modules accepted: Orders

## 2024-04-06 NOTE — Telephone Encounter (Signed)
 Rx not sent. Pt needs appointment Rx pended to chart for appt on 04/15/24

## 2024-04-15 ENCOUNTER — Encounter: Payer: Self-pay | Admitting: Family Medicine

## 2024-04-15 ENCOUNTER — Ambulatory Visit: Admitting: Family Medicine

## 2024-04-15 VITALS — BP 109/78 | HR 71 | Temp 97.7°F | Resp 16 | Wt 219.4 lb

## 2024-04-15 DIAGNOSIS — E038 Other specified hypothyroidism: Secondary | ICD-10-CM | POA: Diagnosis not present

## 2024-04-15 DIAGNOSIS — E66813 Obesity, class 3: Secondary | ICD-10-CM

## 2024-04-15 DIAGNOSIS — E782 Mixed hyperlipidemia: Secondary | ICD-10-CM

## 2024-04-15 DIAGNOSIS — Z6841 Body Mass Index (BMI) 40.0 and over, adult: Secondary | ICD-10-CM

## 2024-04-15 DIAGNOSIS — I10 Essential (primary) hypertension: Secondary | ICD-10-CM | POA: Diagnosis not present

## 2024-04-15 MED ORDER — AMOXICILLIN-POT CLAVULANATE 875-125 MG PO TABS: 1.0000 | ORAL_TABLET | Freq: Two times a day (BID) | ORAL | 0 refills | Status: DC

## 2024-04-15 MED ORDER — HYDROCHLOROTHIAZIDE 25 MG PO TABS
25.0000 mg | ORAL_TABLET | Freq: Every day | ORAL | 3 refills | Status: DC
Start: 2024-04-15 — End: 2024-04-22

## 2024-04-15 MED ORDER — LABETALOL HCL 200 MG PO TABS
200.0000 mg | ORAL_TABLET | Freq: Two times a day (BID) | ORAL | 3 refills | Status: DC
Start: 2024-04-15 — End: 2024-05-13

## 2024-04-15 MED ORDER — AMLODIPINE BESYLATE 5 MG PO TABS
5.0000 mg | ORAL_TABLET | Freq: Every day | ORAL | 3 refills | Status: DC
Start: 2024-04-15 — End: 2024-05-11

## 2024-04-15 NOTE — Progress Notes (Signed)
 Established Patient Office Visit  Subjective   Patient ID: Rebecca Freeman, female    DOB: 1981/03/26  Age: 43 y.o. MRN: [id number]  Chief Complaint  Patient presents with   Transitions Of Care   Medication Refill   Sinus Problem    Symptoms: runny nose, congestion, sinus pressure/headache. No fever, sore throat. Frequency: last Sunday   Discussed the use of AI scribe software for clinical note transcription with the patient, who gave verbal consent to proceed. History of Present Illness Rebecca Freeman is a 43 year old female with hypertension who presents with sinus congestion and pressure.  Sinus congestion and facial pressure - Sinus congestion and pressure present since Sunday - Associated facial swelling and sensation of puffiness - Uses Tylenol  Sinus for symptom relief - No use of intranasal sprays  Allergic rhinitis and sinus infections - Takes Claritin  daily for seasonal allergies - History of significant sinus infections related to allergies  Hypertension management - Hypertension managed with amlodipine  5 mg and hydrochlorothiazide  25mg , and labetalol  200mg  BID - Blood pressure remains stable on current regimen      04/15/2024    8:50 AM 06/05/2023    2:01 PM 05/14/2022    3:44 PM  Depression screen PHQ 2/9  Decreased Interest 0 0 0  Down, Depressed, Hopeless 0 0 0  PHQ - 2 Score 0 0 0  Altered sleeping 0  0  Tired, decreased energy 1  0  Change in appetite 0  0  Feeling bad or failure about yourself  0  0  Trouble concentrating 0  0  Moving slowly or fidgety/restless 0  0  Suicidal thoughts 0  0  PHQ-9 Score 1  0  Difficult doing work/chores Not difficult at all  Not difficult at all       04/15/2024    8:50 AM 07/25/2021    3:43 PM  GAD 7 : Generalized Anxiety Score  Nervous, Anxious, on Edge 0 0  Control/stop worrying 0 0  Worry too much - different things 0 0  Trouble relaxing 0 0  Restless 0 0  Easily annoyed or irritable 0 0   Afraid - awful might happen 0 0  Total GAD 7 Score 0 0  Anxiety Difficulty Not difficult at all Not difficult at all     Review of Systems  HENT:  Positive for congestion and sinus pain.   All other systems reviewed and are negative.   Negative unless indicated in HPI   Objective:     BP 109/78 (BP Location: Left Arm, Patient Position: Sitting, Cuff Size: Large)   Pulse 71   Temp 97.7 F (36.5 C)   Resp 16   Wt 219 lb 6.4 oz (99.5 kg)   SpO2 98%   BMI 41.46 kg/m    Physical Exam Constitutional:      General: She is not in acute distress.    Appearance: Normal appearance. She is obese. She is not toxic-appearing or diaphoretic.  HENT:     Head: Normocephalic.     Right Ear: Hearing and ear canal normal. A middle ear effusion is present. Tympanic membrane is not injected or erythematous.     Left Ear: Hearing and ear canal normal. A middle ear effusion is present. Tympanic membrane is not injected or erythematous.     Nose: Congestion and rhinorrhea present.     Right Turbinates: Enlarged and swollen.     Left Turbinates: Enlarged and swollen.  Right Sinus: Maxillary sinus tenderness and frontal sinus tenderness present.     Left Sinus: Maxillary sinus tenderness and frontal sinus tenderness present.     Mouth/Throat:     Mouth: Mucous membranes are moist.     Pharynx: Oropharynx is clear.  Eyes:     Extraocular Movements: Extraocular movements intact.     Pupils: Pupils are equal, round, and reactive to light.  Cardiovascular:     Rate and Rhythm: Normal rate and regular rhythm.     Pulses: Normal pulses.     Heart sounds: Normal heart sounds. No murmur heard.    No friction rub. No gallop.  Pulmonary:     Effort: No respiratory distress.     Breath sounds: No stridor. No wheezing, rhonchi or rales.  Chest:     Chest wall: No tenderness.  Musculoskeletal:     Right lower leg: No edema.     Left lower leg: No edema.  Skin:    General: Skin is warm and  dry.     Capillary Refill: Capillary refill takes less than 2 seconds.  Neurological:     General: No focal deficit present.     Mental Status: She is alert and oriented to person, place, and time. Mental status is at baseline.     Cranial Nerves: No cranial nerve deficit.     Motor: No weakness.     Gait: Gait normal.  Psychiatric:        Mood and Affect: Mood normal.        Behavior: Behavior normal.        Thought Content: Thought content normal.        Judgment: Judgment normal.      No results found for any visits on 04/15/24.    The 10-year ASCVD risk score (Arnett DK, et al., 2019) is: 0.7%    Assessment & Plan:  Moderate mixed hyperlipidemia not requiring statin therapy -     Lipid panel  Chronic hypertension -     hydroCHLOROthiazide ; Take 1 tablet (25 mg total) by mouth daily.  Dispense: 90 tablet; Refill: 3 -     Comprehensive metabolic panel with GFR -     amLODIPine  Besylate; Take 1 tablet (5 mg total) by mouth daily.  Dispense: 90 tablet; Refill: 3 -     Labetalol  HCl; Take 1 tablet (200 mg total) by mouth 2 (two) times daily.  Dispense: 180 tablet; Refill: 3  Subclinical hypothyroidism -     TSH  Class 3 severe obesity due to excess calories without serious comorbidity with body mass index (BMI) of 40.0 to 44.9 in adult  Other orders -     Amoxicillin-Pot Clavulanate; Take 1 tablet by mouth 2 (two) times daily.  Dispense: 20 tablet; Refill: 0     Assessment and Plan Assessment & Plan Acute sinusitis Acute sinusitis with facial pain, pressure, swelling, and fluid behind ears indicating sinus involvement. - Prescribe Augmentin BID for 10 days. - Advise use of Flonase nasal spray. - Recommend saline nasal spray. - Suggest Mucinex for secretion loosening. - May use tylenol  for headaches  Hypertension - Controlled, stabled - Continue amlodipine  5mg  daily, HCTZ 25mg  daily, and Labetalol  200mg  BID - Continue current antihypertensive regimen. - Follow  up in 6 months for chronic disease management. - Will check CMP today for kidney function  Hyperlipidemia Hyperlipidemia with no specific treatment changes discussed. - Order lipid panel. - Manage through lifestyle - Continue to make conscious decisions for  well balanced diet smaller portions with increase protein, fruits, veggies, water as drink of choice, decrease starches, processed foods, and saturated fats. Increase weekly exercise - 150 minutes per week.   Allergic rhinitis Chronic allergic rhinitis managed with Claritin . Symptoms occasionally flare due to environmental triggers. - Advise switching to Zyrtec or Allegra if Claritin  becomes ineffective.  Subclinical Hypothyroidism Had tried synthroid  - did not tolerate medication. Currently not medicated - Order thyroid  function tests to assess levels  BMI = 41.46 - Recommend purposeful exercise - Well balanced diet   Return in about 6 months (around 10/16/2024) for Chronic Disease Mgmt.   Introduced to Publishing rights manager role and practice setting.  All questions answered.  Discussed provider/patient relationship and expectations.   Curtis DELENA Boom, FNP

## 2024-04-16 LAB — LIPID PANEL
Chol/HDL Ratio: 4.3 ratio (ref 0.0–4.4)
Cholesterol, Total: 221 mg/dL — ABNORMAL HIGH (ref 100–199)
HDL: 51 mg/dL (ref >39)
LDL Chol Calc (NIH): 143 mg/dL — ABNORMAL HIGH (ref 0–99)
Triglycerides: 149 mg/dL (ref 0–149)
VLDL Cholesterol Cal: 27 mg/dL (ref 5–40)

## 2024-04-16 LAB — COMPREHENSIVE METABOLIC PANEL WITH GFR
ALT: 26 IU/L (ref 0–32)
AST: 19 IU/L (ref 0–40)
Albumin: 4.4 g/dL (ref 3.9–4.9)
Alkaline Phosphatase: 55 IU/L (ref 44–121)
BUN/Creatinine Ratio: 20 (ref 9–23)
BUN: 22 mg/dL (ref 6–24)
Bilirubin Total: 0.3 mg/dL (ref 0.0–1.2)
CO2: 24 mmol/L (ref 20–29)
Calcium: 9.5 mg/dL (ref 8.7–10.2)
Chloride: 100 mmol/L (ref 96–106)
Creatinine, Ser: 1.11 mg/dL — ABNORMAL HIGH (ref 0.57–1.00)
Globulin, Total: 2.2 g/dL (ref 1.5–4.5)
Glucose: 96 mg/dL (ref 70–99)
Potassium: 4 mmol/L (ref 3.5–5.2)
Sodium: 139 mmol/L (ref 134–144)
Total Protein: 6.6 g/dL (ref 6.0–8.5)
eGFR: 64 mL/min/1.73 (ref >59)

## 2024-04-16 LAB — TSH: TSH: 5.97 u[IU]/mL — ABNORMAL HIGH (ref 0.450–4.500)

## 2024-04-18 ENCOUNTER — Ambulatory Visit: Payer: Self-pay | Admitting: Family Medicine

## 2024-04-22 ENCOUNTER — Telehealth: Payer: Self-pay | Admitting: Family Medicine

## 2024-04-22 DIAGNOSIS — I10 Essential (primary) hypertension: Secondary | ICD-10-CM

## 2024-04-22 MED ORDER — HYDROCHLOROTHIAZIDE 25 MG PO TABS: 25.0000 mg | ORAL_TABLET | Freq: Every day | ORAL | 3 refills | Status: DC

## 2024-04-22 NOTE — Telephone Encounter (Signed)
 [facility] faxed refill request for the following medications:   hydrochlorothiazide  (HYDRODIURIL ) 25 MG tablet     Please advise

## 2024-04-22 NOTE — Addendum Note (Signed)
 Addended by: Waylin Dorko S on: 04/22/2024 01:04 PM   Modules accepted: Orders

## 2024-04-26 MED ORDER — HYDROCHLOROTHIAZIDE 25 MG PO TABS: 25.0000 mg | ORAL_TABLET | Freq: Every day | ORAL | 3 refills | Status: AC

## 2024-04-26 NOTE — Telephone Encounter (Signed)
 Received another urgent refill request from [facility] for hydrochlorothiazide  (HYDRODIURIL ) 25 MG tablet .  Looks like it was sent to [facility].  Please send to correct pharmacy.

## 2024-04-26 NOTE — Addendum Note (Signed)
 Addended by: WILFRED HARGIS RAMAN on: 04/26/2024 10:50 AM   Modules accepted: Orders

## 2024-04-26 NOTE — Telephone Encounter (Signed)
 Called [facility], rx has been cancelled. Will resend to [facility].

## 2024-05-11 ENCOUNTER — Other Ambulatory Visit: Payer: Self-pay | Admitting: Family Medicine

## 2024-05-11 ENCOUNTER — Telehealth: Payer: Self-pay | Admitting: Family Medicine

## 2024-05-11 DIAGNOSIS — I10 Essential (primary) hypertension: Secondary | ICD-10-CM

## 2024-05-11 MED ORDER — AMLODIPINE BESYLATE 5 MG PO TABS: 5.0000 mg | ORAL_TABLET | Freq: Every day | ORAL | 3 refills | Status: AC

## 2024-05-11 NOTE — Telephone Encounter (Signed)
 Refilled on 04/15/24, 90 daily supply, with 3 refills. Sent to [facility]. Does patient need sent to [facility] instead?

## 2024-05-11 NOTE — Telephone Encounter (Signed)
 Noted. Thank you for filling to [facility].

## 2024-05-13 ENCOUNTER — Telehealth: Payer: Self-pay | Admitting: Family Medicine

## 2024-05-13 ENCOUNTER — Other Ambulatory Visit: Payer: Self-pay | Admitting: Family Medicine

## 2024-05-13 DIAGNOSIS — I10 Essential (primary) hypertension: Secondary | ICD-10-CM

## 2024-05-13 MED ORDER — LABETALOL HCL 200 MG PO TABS: 200.0000 mg | ORAL_TABLET | Freq: Two times a day (BID) | ORAL | 3 refills | Status: AC

## 2024-05-13 NOTE — Telephone Encounter (Signed)
 Noted sent Normodyne  200mg  to [facility]

## 2024-05-13 NOTE — Telephone Encounter (Signed)
 Patient uses [facility] for all of her medications except Claritin .   Normodyne  tab 200 mg. Was sent to [facility] on 04/15/24 but she hasn't picked those up.   This Rx needs to be sent to [facility] not [facility].  Please correct.

## 2024-08-10 ENCOUNTER — Telehealth: Payer: Self-pay | Admitting: Family Medicine

## 2024-08-10 ENCOUNTER — Other Ambulatory Visit: Payer: Self-pay

## 2024-08-10 DIAGNOSIS — J309 Allergic rhinitis, unspecified: Secondary | ICD-10-CM

## 2024-08-10 MED ORDER — LORATADINE 10 MG PO TABS: 10.0000 mg | ORAL_TABLET | Freq: Every day | ORAL | 3 refills | Status: AC

## 2024-08-10 NOTE — Telephone Encounter (Signed)
 Converted into a refill request

## 2024-08-10 NOTE — Telephone Encounter (Signed)
 [facility] faxed refill request for the following medications:   loratadine  (CLARITIN ) 10 MG tablet    Please advise.

## 2024-10-19 ENCOUNTER — Ambulatory Visit: Admitting: Family Medicine
